# Patient Record
Sex: Female | Born: 1975 | Race: Black or African American | Hispanic: No | Marital: Married | Smoking: Never smoker
Health system: Southern US, Community
[De-identification: ages and names within clinical notes are randomized; demographics above are authoritative.]

## PROBLEM LIST (undated history)

## (undated) DIAGNOSIS — N390 Urinary tract infection, site not specified: Secondary | ICD-10-CM

## (undated) DIAGNOSIS — K5792 Diverticulitis of intestine, part unspecified, without perforation or abscess without bleeding: Secondary | ICD-10-CM

## (undated) DIAGNOSIS — M545 Low back pain, unspecified: Secondary | ICD-10-CM

## (undated) DIAGNOSIS — B019 Varicella without complication: Secondary | ICD-10-CM

## (undated) DIAGNOSIS — O09529 Supervision of elderly multigravida, unspecified trimester: Secondary | ICD-10-CM

## (undated) DIAGNOSIS — E559 Vitamin D deficiency, unspecified: Secondary | ICD-10-CM

## (undated) HISTORY — DX: Urinary tract infection, site not specified: N39.0

## (undated) HISTORY — DX: Vitamin D deficiency, unspecified: E55.9

## (undated) HISTORY — DX: Diverticulitis of intestine, part unspecified, without perforation or abscess without bleeding: K57.92

## (undated) HISTORY — PX: NO PAST SURGERIES: SHX2092

## (undated) HISTORY — DX: Varicella without complication: B01.9

## (undated) HISTORY — DX: Low back pain, unspecified: M54.50

## (undated) HISTORY — DX: Low back pain: M54.5

---

## 2005-06-13 ENCOUNTER — Emergency Department: Payer: Self-pay | Admitting: Emergency Medicine

## 2006-09-24 ENCOUNTER — Ambulatory Visit: Payer: Self-pay | Admitting: Internal Medicine

## 2008-11-13 ENCOUNTER — Emergency Department: Payer: Self-pay | Admitting: Emergency Medicine

## 2010-07-10 ENCOUNTER — Ambulatory Visit: Payer: Self-pay | Admitting: Internal Medicine

## 2012-03-27 ENCOUNTER — Emergency Department: Payer: Self-pay | Admitting: Emergency Medicine

## 2013-04-30 ENCOUNTER — Emergency Department: Payer: Self-pay | Admitting: Internal Medicine

## 2013-07-10 ENCOUNTER — Emergency Department: Payer: Self-pay

## 2013-07-10 LAB — URINALYSIS, COMPLETE
BACTERIA: NONE SEEN
RBC,UR: 6 /HPF (ref 0–5)
SPECIFIC GRAVITY: 1.029 (ref 1.003–1.030)
Squamous Epithelial: 7
WBC UR: 22 /HPF (ref 0–5)

## 2014-10-28 ENCOUNTER — Other Ambulatory Visit: Payer: Self-pay | Admitting: Obstetrics and Gynecology

## 2014-10-28 DIAGNOSIS — Z3689 Encounter for other specified antenatal screening: Secondary | ICD-10-CM

## 2014-11-01 ENCOUNTER — Ambulatory Visit
Admission: RE | Admit: 2014-11-01 | Discharge: 2014-11-01 | Disposition: A | Discharge status: Home / Self Care | Payer: 59 | Source: Ambulatory Visit | Attending: Maternal & Fetal Medicine | Admitting: Maternal & Fetal Medicine

## 2014-11-01 ENCOUNTER — Ambulatory Visit: Payer: Self-pay

## 2014-11-01 DIAGNOSIS — Z36 Encounter for antenatal screening of mother: Secondary | ICD-10-CM | POA: Diagnosis not present

## 2014-11-01 DIAGNOSIS — O09521 Supervision of elderly multigravida, first trimester: Secondary | ICD-10-CM | POA: Insufficient documentation

## 2014-11-01 DIAGNOSIS — Z3A10 10 weeks gestation of pregnancy: Secondary | ICD-10-CM | POA: Insufficient documentation

## 2014-11-01 DIAGNOSIS — Z3689 Encounter for other specified antenatal screening: Secondary | ICD-10-CM

## 2014-11-01 HISTORY — DX: Supervision of elderly multigravida, unspecified trimester: O09.529

## 2015-03-12 ENCOUNTER — Emergency Department
Admission: EM | Admit: 2015-03-12 | Discharge: 2015-03-12 | Disposition: A | Discharge status: Home / Self Care | Payer: 59 | Attending: Emergency Medicine | Admitting: Emergency Medicine

## 2015-03-12 ENCOUNTER — Encounter: Payer: Self-pay | Admitting: Emergency Medicine

## 2015-03-12 DIAGNOSIS — H6123 Impacted cerumen, bilateral: Secondary | ICD-10-CM | POA: Diagnosis not present

## 2015-03-12 DIAGNOSIS — Z3A37 37 weeks gestation of pregnancy: Secondary | ICD-10-CM | POA: Diagnosis not present

## 2015-03-12 DIAGNOSIS — O9989 Other specified diseases and conditions complicating pregnancy, childbirth and the puerperium: Secondary | ICD-10-CM | POA: Insufficient documentation

## 2015-03-12 DIAGNOSIS — O09523 Supervision of elderly multigravida, third trimester: Secondary | ICD-10-CM | POA: Diagnosis not present

## 2015-03-12 DIAGNOSIS — Z79899 Other long term (current) drug therapy: Secondary | ICD-10-CM | POA: Insufficient documentation

## 2015-03-12 MED ORDER — CARBAMIDE PEROXIDE 6.5 % OT SOLN
OTIC | Status: AC
  Administered 2015-03-12: 5 [drp] via OTIC
  Filled 2015-03-12: qty 15

## 2015-03-12 MED ORDER — CARBAMIDE PEROXIDE 6.5 % OT SOLN
5.0000 [drp] | Freq: Once | OTIC | Status: AC
  Administered 2015-03-12: 5 [drp] via OTIC

## 2015-03-12 NOTE — ED Provider Notes (Signed)
[facility] Emergency Department Provider Note  ____________________________________________  Time seen: Approximately 7:22 PM  I have reviewed the triage vital signs and the nursing notes.   HISTORY  Chief Complaint Otalgia   HPI Marilyn Hamilton is a 39 y.o. female who presents to the emergency department for evaluation of right earache. She states that the left one is also somewhat painful, but the right one is very painful and she has very little hearing on the right side. She states that about 6 weeks ago she advised her gynecologist of symptoms and he told her at that time that she had some fluid in her ears as well as a little bit of wax buildup. Over the past3 days she has had an increase in pain and a decrease in hearing in the right side. She is [redacted] weeks pregnant and has not taken any medication.   Past Medical History  Diagnosis Date  . AMA (advanced maternal age) multigravida 35+     There are no active problems to display for this patient.   Past Surgical History  Procedure Laterality Date  . No past surgeries      Current Outpatient Rx  Name  Route  Sig  Dispense  Refill  . Prenatal Vit-Fe Fumarate-FA (PRENATAL MULTIVITAMIN) TABS tablet   Oral   Take 1 tablet by mouth daily at 12 noon.           Allergies Review of patient's allergies indicates no known allergies.  No family history on file.  Social History Social History  Substance Use Topics  . Smoking status: Never Smoker   . Smokeless tobacco: Never Used  . Alcohol Use: No    Review of Systems Constitutional: Negative for fever. Eyes: No visual changes. ENT: Negative for sore throat; negative for difficulty swallowing. Positive for otalgia bilaterally Respiratory: Denies shortness of breath. Gastrointestinal: No abdominal pain.  No nausea, no vomiting.  No diarrhea.  Genitourinary: Negative for dysuria. Musculoskeletal: Negative for generalized body aches. Skin:  Negative for rash. Neurological: Negative for headaches, focal weakness or numbness.  10-point ROS otherwise negative.  ____________________________________________   PHYSICAL EXAM:  VITAL SIGNS: ED Triage Vitals  Enc Vitals Group     BP 03/12/15 1834 119/59 mmHg     Pulse Rate 03/12/15 1834 83     Resp 03/12/15 1834 20     Temp 03/12/15 1834 98.4 F (36.9 C)     Temp Source 03/12/15 1834 Oral     SpO2 03/12/15 1834 99 %     Weight 03/12/15 1834 225 lb (102.059 kg)     Height 03/12/15 1834 5\' 4"  (1.626 m)     Head Cir --      Peak Flow --      Pain Score 03/12/15 1835 5     Pain Loc --      Pain Edu? --      Excl. in GC? --     Constitutional: Alert and oriented. Well appearing and in no acute distress. Eyes: Conjunctivae are normal. PERRL. EOMI. Head: Atraumatic. Nose: No congestion/rhinnorhea. Mouth/Throat: Mucous membranes are moist.  Oropharynx not erythematous, no exudate. Ears: Cerumen impaction noted in the right that is occluding view of the tympanic membrane, partial cerumen occlusion in the left however the tympanic membrane is visible and appears to be normal. Neck: No stridor.  Lymphatic: Lymphadenopathy: No palpable nodes Cardiovascular: Normal rate, regular rhythm. Good peripheral circulation. Respiratory: Normal respiratory effort. Lungs CTAB. Gastrointestinal: Soft and nontender.  Musculoskeletal: No lower extremity tenderness nor edema.   Neurologic:  Normal speech and language. No gross focal neurologic deficits are appreciated. Speech is normal. No gait instability. Skin:  Skin is warm, dry and intact. No rash noted Psychiatric: Mood and affect are normal. Speech and behavior are normal.  ____________________________________________   LABS (all labs ordered are listed, but only abnormal results are displayed)  Labs Reviewed - No data to  display ____________________________________________  EKG   ____________________________________________  RADIOLOGY   ____________________________________________   PROCEDURES  Procedure(s) performed:   Cerumen disimpacted via curet. The remainder of the impaction was removed via irrigation with warm tap water and peroxide with good results. Patient reported being able to hear almost normally out of the right ear after irrigation. Tympanic membrane was then visible and does not appear to be punctured, nor does there appear to be any infection.  Critical Care performed: No  ____________________________________________   INITIAL IMPRESSION / ASSESSMENT AND PLAN / ED COURSE  Pertinent labs & imaging results that were available during my care of the patient were reviewed by me and considered in my medical decision making (see chart for details).  Patient was advised to follow-up with the primary care provider for symptoms are not improving. She was advised not to use cotton balls or Q-tips in her ears. She was advised to return to the emergency department for symptoms that change or worsen if she is unable schedule an appointment. ____________________________________________   FINAL CLINICAL IMPRESSION(S) / ED DIAGNOSES  Final diagnoses:  Cerumen impaction, bilateral      Chinita Pester, FNP 03/12/15 2311  Sharman Cheek, MD 03/15/15 1517

## 2015-03-12 NOTE — ED Notes (Signed)
Bilateral earache x 3 days, no fevers. [redacted] weeks pregnant with no problems related to pregnancy.

## 2015-03-12 NOTE — ED Notes (Signed)
Medication applied to right ear and 10 minutes later ear was irrigated. Small amount of ear wax irrigated from ear. But pt reports no improvement in hearing. Medication applied to left ear and small amount reapplied to right ear.

## 2015-03-28 ENCOUNTER — Inpatient Hospital Stay
Admission: EM | Admit: 2015-03-28 | Discharge: 2015-04-01 | DRG: 765 | Disposition: A | Discharge status: Home / Self Care | Payer: 59 | Attending: Obstetrics & Gynecology | Admitting: Obstetrics & Gynecology

## 2015-03-28 DIAGNOSIS — O9081 Anemia of the puerperium: Secondary | ICD-10-CM | POA: Diagnosis not present

## 2015-03-28 DIAGNOSIS — O339 Maternal care for disproportion, unspecified: Secondary | ICD-10-CM | POA: Diagnosis present

## 2015-03-28 DIAGNOSIS — Z302 Encounter for sterilization: Secondary | ICD-10-CM | POA: Diagnosis not present

## 2015-03-28 DIAGNOSIS — O480 Post-term pregnancy: Principal | ICD-10-CM | POA: Diagnosis present

## 2015-03-28 DIAGNOSIS — D62 Acute posthemorrhagic anemia: Secondary | ICD-10-CM | POA: Diagnosis not present

## 2015-03-28 DIAGNOSIS — Z6839 Body mass index (BMI) 39.0-39.9, adult: Secondary | ICD-10-CM

## 2015-03-28 DIAGNOSIS — O99824 Streptococcus B carrier state complicating childbirth: Secondary | ICD-10-CM | POA: Diagnosis present

## 2015-03-28 DIAGNOSIS — Z3A39 39 weeks gestation of pregnancy: Secondary | ICD-10-CM | POA: Diagnosis not present

## 2015-03-28 DIAGNOSIS — O99214 Obesity complicating childbirth: Secondary | ICD-10-CM | POA: Diagnosis present

## 2015-03-28 DIAGNOSIS — E669 Obesity, unspecified: Secondary | ICD-10-CM | POA: Diagnosis present

## 2015-03-28 DIAGNOSIS — Z98891 History of uterine scar from previous surgery: Secondary | ICD-10-CM

## 2015-03-28 DIAGNOSIS — O09523 Supervision of elderly multigravida, third trimester: Secondary | ICD-10-CM

## 2015-03-28 DIAGNOSIS — O133 Gestational [pregnancy-induced] hypertension without significant proteinuria, third trimester: Secondary | ICD-10-CM | POA: Diagnosis present

## 2015-03-28 LAB — CBC
HCT: 36.9 % (ref 35.0–47.0)
Hemoglobin: 12.1 g/dL (ref 12.0–16.0)
MCH: 29.8 pg (ref 26.0–34.0)
MCHC: 32.7 g/dL (ref 32.0–36.0)
MCV: 91.1 fL (ref 80.0–100.0)
PLATELETS: 214 10*3/uL (ref 150–440)
RBC: 4.06 MIL/uL (ref 3.80–5.20)
RDW: 15.4 % — AB (ref 11.5–14.5)
WBC: 10.5 10*3/uL (ref 3.6–11.0)

## 2015-03-28 LAB — TYPE AND SCREEN
ABO/RH(D): O POS
Antibody Screen: NEGATIVE

## 2015-03-28 LAB — ABO/RH: ABO/RH(D): O POS

## 2015-03-28 MED ORDER — OXYTOCIN 40 UNITS IN LACTATED RINGERS INFUSION - SIMPLE MED
2.5000 [IU]/h | INTRAVENOUS | Status: DC
  Filled 2015-03-28: qty 1000

## 2015-03-28 MED ORDER — MISOPROSTOL 200 MCG PO TABS: 800.0000 ug | ORAL_TABLET | Freq: Once | ORAL | Status: DC | PRN

## 2015-03-28 MED ORDER — OXYTOCIN BOLUS FROM INFUSION: 500.0000 mL | INTRAVENOUS | Status: DC

## 2015-03-28 MED ORDER — DINOPROSTONE 10 MG VA INST
10.0000 mg | VAGINAL_INSERT | Freq: Once | VAGINAL | Status: AC
  Administered 2015-03-28: 10 mg via VAGINAL
  Filled 2015-03-28: qty 1

## 2015-03-28 MED ORDER — LIDOCAINE HCL (PF) 1 % IJ SOLN: 30.0000 mL | INTRAMUSCULAR | Status: DC | PRN

## 2015-03-28 MED ORDER — AMMONIA AROMATIC IN INHA: 0.3000 mL | Freq: Once | RESPIRATORY_TRACT | Status: DC | PRN

## 2015-03-28 MED ORDER — LACTATED RINGERS IV SOLN: 500.0000 mL | INTRAVENOUS | Status: DC | PRN

## 2015-03-28 MED ORDER — AMMONIA AROMATIC IN INHA
RESPIRATORY_TRACT | Status: AC
Start: 2015-03-28 — End: 2015-03-29
  Filled 2015-03-28: qty 10

## 2015-03-28 MED ORDER — LIDOCAINE HCL (PF) 1 % IJ SOLN
INTRAMUSCULAR | Status: AC
  Filled 2015-03-28: qty 30

## 2015-03-28 MED ORDER — SODIUM CHLORIDE 0.9 % IV SOLN
2.0000 g | Freq: Once | INTRAVENOUS | Status: AC
  Administered 2015-03-28: 2 g via INTRAVENOUS
  Filled 2015-03-28: qty 2000

## 2015-03-28 MED ORDER — ZOLPIDEM TARTRATE 5 MG PO TABS
5.0000 mg | ORAL_TABLET | Freq: Every evening | ORAL | Status: DC | PRN
Start: 2015-03-28 — End: 2015-03-30
  Administered 2015-03-28: 5 mg via ORAL
  Filled 2015-03-28: qty 1

## 2015-03-28 MED ORDER — SODIUM CHLORIDE 0.9 % IV SOLN
1.0000 g | INTRAVENOUS | Status: DC
  Administered 2015-03-29 (×6): 1 g via INTRAVENOUS
  Filled 2015-03-28 (×6): qty 1000

## 2015-03-28 MED ORDER — LACTATED RINGERS IV SOLN
INTRAVENOUS | Status: DC
  Administered 2015-03-28 – 2015-03-29 (×2): via INTRAVENOUS

## 2015-03-28 MED ORDER — MISOPROSTOL 200 MCG PO TABS
ORAL_TABLET | ORAL | Status: AC
  Filled 2015-03-28: qty 4

## 2015-03-28 MED ORDER — TERBUTALINE SULFATE 1 MG/ML IJ SOLN: 0.2500 mg | Freq: Once | INTRAMUSCULAR | Status: DC | PRN

## 2015-03-28 MED ORDER — ONDANSETRON HCL 4 MG/2ML IJ SOLN: 4.0000 mg | Freq: Four times a day (QID) | INTRAMUSCULAR | Status: DC | PRN

## 2015-03-28 MED ORDER — OXYTOCIN 10 UNIT/ML IJ SOLN
INTRAMUSCULAR | Status: AC
  Filled 2015-03-28: qty 2

## 2015-03-28 NOTE — H&P (Signed)
Date of Initial H&P: 03/28/2015  History reviewed, patient examined, no change in H&P  40 year old 354 [id number] Black female with EDC=03/29/2015 by a 10 wk scan presents at 39.6 weeks for an IOL for advanced maternal age 42>40. [facility] for a normal anatomy scan (declined genetic testing), obesity with BMI=37and a total weight gain of 30#, a mildly elevated hmg A1C of 5.7% with a normal 1 hr GTTof 88 and 128, and positive GBS. Her last growth scan at 34 weeks revealed EFW in the 40th% (5#7oz). Nonstress tests since 36 weeks have been reactive and AFIs WNL.   O POS/ RI/ VI/ GBS positive/ TDAP UTD  Exam: BP 131/78 mmHg  Pulse 80  Temp(Src) 98 F (36.7 C) (Oral)  Resp 18  Ht 5\' 4"  (1.626 m)  Wt 105.235 kg (232 lb)  BMI 39.80 kg/m2  FHR: 140s with accelerations to 160s to 170, moderate variability Toco: mild contractions q3-9 min apart Cervix: closed/ 50%/-1/ vtx/ posterior/ med   A: IUP at 39.6 weeks for IOL for AMA Cat 1 tracing Bishop score of 4  P: Cervidil this evening Reviewed process of induction and risks of hyperstimulation,  fetal intolerance, failed induction of labor, failure to progress and C-section. She agrees with POM. Desires epidural in active labor Desires BTL if has Cesarean section GBS positive-start PPX  Siler Mavis, CNM

## 2015-03-29 ENCOUNTER — Inpatient Hospital Stay: Payer: 59 | Admitting: Anesthesiology

## 2015-03-29 DIAGNOSIS — O133 Gestational [pregnancy-induced] hypertension without significant proteinuria, third trimester: Secondary | ICD-10-CM | POA: Diagnosis present

## 2015-03-29 LAB — COMPREHENSIVE METABOLIC PANEL
ALBUMIN: 3 g/dL — AB (ref 3.5–5.0)
ALK PHOS: 170 U/L — AB (ref 38–126)
ALT: 12 U/L — AB (ref 14–54)
AST: 20 U/L (ref 15–41)
Anion gap: 9 (ref 5–15)
BUN: 11 mg/dL (ref 6–20)
CALCIUM: 9.7 mg/dL (ref 8.9–10.3)
CHLORIDE: 107 mmol/L (ref 101–111)
CO2: 20 mmol/L — AB (ref 22–32)
CREATININE: 0.77 mg/dL (ref 0.44–1.00)
GFR calc Af Amer: >60 mL/min (ref >60)
GFR calc non Af Amer: >60 mL/min (ref >60)
GLUCOSE: 90 mg/dL (ref 65–99)
Potassium: 3.8 mmol/L (ref 3.5–5.1)
SODIUM: 136 mmol/L (ref 135–145)
Total Bilirubin: 0.4 mg/dL (ref 0.3–1.2)
Total Protein: 6.7 g/dL (ref 6.5–8.1)

## 2015-03-29 LAB — PROTEIN / CREATININE RATIO, URINE
Creatinine, Urine: 177 mg/dL
Protein Creatinine Ratio: 0.14 mg/mg{Cre} (ref 0.00–0.15)
TOTAL PROTEIN, URINE: 24 mg/dL

## 2015-03-29 LAB — CBC
HEMATOCRIT: 37.1 % (ref 35.0–47.0)
Hemoglobin: 12.3 g/dL (ref 12.0–16.0)
MCH: 30.6 pg (ref 26.0–34.0)
MCHC: 33.1 g/dL (ref 32.0–36.0)
MCV: 92.6 fL (ref 80.0–100.0)
PLATELETS: 211 10*3/uL (ref 150–440)
RBC: 4.01 MIL/uL (ref 3.80–5.20)
RDW: 15.4 % — AB (ref 11.5–14.5)
WBC: 13.8 10*3/uL — AB (ref 3.6–11.0)

## 2015-03-29 MED ORDER — SODIUM CHLORIDE 0.9 % IJ SOLN
INTRAMUSCULAR | Status: AC
  Filled 2015-03-29: qty 12

## 2015-03-29 MED ORDER — FENTANYL 2.5 MCG/ML W/ROPIVACAINE 0.2% IN NS 100 ML EPIDURAL INFUSION (ARMC-ANES)
EPIDURAL | Status: AC
  Administered 2015-03-29: 8 mL/h via EPIDURAL
  Filled 2015-03-29: qty 100

## 2015-03-29 MED ORDER — IBUPROFEN 600 MG PO TABS: 600.0000 mg | ORAL_TABLET | Freq: Four times a day (QID) | ORAL | Status: DC | PRN

## 2015-03-29 MED ORDER — NALOXONE HCL 0.4 MG/ML IJ SOLN: 0.4000 mg | INTRAMUSCULAR | Status: DC | PRN

## 2015-03-29 MED ORDER — SCOPOLAMINE 1 MG/3DAYS TD PT72: 1.0000 | MEDICATED_PATCH | Freq: Once | TRANSDERMAL | Status: DC

## 2015-03-29 MED ORDER — BUTORPHANOL TARTRATE 1 MG/ML IJ SOLN
1.0000 mg | INTRAMUSCULAR | Status: DC | PRN
  Administered 2015-03-29: 1 mg via INTRAVENOUS

## 2015-03-29 MED ORDER — OXYTOCIN 40 UNITS IN LACTATED RINGERS INFUSION - SIMPLE MED
1.0000 m[IU]/min | INTRAVENOUS | Status: DC
  Administered 2015-03-29: 2 m[IU]/min via INTRAVENOUS

## 2015-03-29 MED ORDER — FENTANYL 2.5 MCG/ML W/ROPIVACAINE 0.2% IN NS 100 ML EPIDURAL INFUSION (ARMC-ANES)
10.0000 mL/h | EPIDURAL | Status: DC
  Administered 2015-03-29: 8 mL/h via EPIDURAL

## 2015-03-29 MED ORDER — DIPHENHYDRAMINE HCL 50 MG/ML IJ SOLN: 12.5000 mg | INTRAMUSCULAR | Status: DC | PRN

## 2015-03-29 MED ORDER — NALBUPHINE HCL 10 MG/ML IJ SOLN: 5.0000 mg | INTRAMUSCULAR | Status: DC | PRN

## 2015-03-29 MED ORDER — BUTORPHANOL TARTRATE 1 MG/ML IJ SOLN
2.0000 mg | INTRAMUSCULAR | Status: DC | PRN
  Administered 2015-03-29 (×2): 2 mg via INTRAVENOUS
  Filled 2015-03-29 (×2): qty 2

## 2015-03-29 MED ORDER — DIPHENHYDRAMINE HCL 25 MG PO CAPS: 25.0000 mg | ORAL_CAPSULE | ORAL | Status: DC | PRN

## 2015-03-29 MED ORDER — NALBUPHINE HCL 10 MG/ML IJ SOLN: 5.0000 mg | Freq: Once | INTRAMUSCULAR | Status: DC | PRN

## 2015-03-29 MED ORDER — SODIUM CHLORIDE 0.9 % IJ SOLN: 3.0000 mL | INTRAMUSCULAR | Status: DC | PRN

## 2015-03-29 MED ORDER — LIDOCAINE-EPINEPHRINE (PF) 1.5 %-1:200000 IJ SOLN
INTRAMUSCULAR | Status: AC | PRN
Start: 2015-03-29 — End: ?
  Administered 2015-03-29: 3 mL via EPIDURAL

## 2015-03-29 MED ORDER — ONDANSETRON HCL 4 MG/2ML IJ SOLN: 4.0000 mg | Freq: Three times a day (TID) | INTRAMUSCULAR | Status: DC | PRN

## 2015-03-29 MED ORDER — TERBUTALINE SULFATE 1 MG/ML IJ SOLN: 0.2500 mg | Freq: Once | INTRAMUSCULAR | Status: DC | PRN

## 2015-03-29 MED ORDER — MEPERIDINE HCL 25 MG/ML IJ SOLN: 6.2500 mg | INTRAMUSCULAR | Status: DC | PRN

## 2015-03-29 MED ORDER — NALOXONE HCL 2 MG/2ML IJ SOSY
1.0000 ug/kg/h | PREFILLED_SYRINGE | INTRAVENOUS | Status: DC | PRN
  Filled 2015-03-29: qty 2

## 2015-03-29 MED ORDER — BUTORPHANOL TARTRATE 1 MG/ML IJ SOLN
INTRAMUSCULAR | Status: AC
  Administered 2015-03-29: 1 mg via INTRAVENOUS
  Filled 2015-03-29: qty 1

## 2015-03-29 NOTE — Anesthesia Procedure Notes (Signed)
Epidural Patient location during procedure: OB Start time: 03/29/2015 8:25 PM End time: 03/29/2015 8:48 PM  Staffing Performed by: anesthesiologist   Preanesthetic Checklist Completed: patient identified, site marked, surgical consent, pre-op evaluation, timeout performed, IV checked, risks and benefits discussed and monitors and equipment checked  Epidural Patient position: sitting Prep: Betadine Patient monitoring: heart rate, continuous pulse ox and blood pressure Approach: midline Location: L4-L5 Injection technique: LOR saline  Needle:  Needle type: Tuohy  Needle gauge: 17 G Needle length: 9 cm and 9 Needle insertion depth: 9 cm Catheter type: closed end flexible Catheter size: 20 Guage Catheter at skin depth: 13 cm Test dose: negative and 1.5% lidocaine with Epi 1:200 K  Assessment Events: blood not aspirated, injection not painful, no injection resistance, negative IV test and no paresthesia  Additional Notes   Patient tolerated the insertion well without complications.Reason for block:procedure for pain

## 2015-03-29 NOTE — Progress Notes (Signed)
L&D Note  03/29/2015 - 9:57 AM  39 y.o. [id number] [redacted]w[redacted]d   Marilyn Hamilton is admitted for IOL for AMA   Subjective:  Resting after stadol dose   Objective:   Filed Vitals:   03/28/15 2317 03/29/15 0234 03/29/15 0516 03/29/15 0844  BP: 146/88 142/86 138/92 128/94  Pulse: 74 87 74 89  Temp: 98 F (36.7 C)   97.7 F (36.5 C)  TempSrc: Oral   Oral  Resp:    18  Height:      Weight:        Current Vital Signs 24h Vital Sign Ranges  T 97.7 F (36.5 C) Temp  Avg: 97.9 F (36.6 C)  Min: 97.7 F (36.5 C)  Max: 98 F (36.7 C)  BP (!) 128/94 mmHg BP  Min: 128/94  Max: 146/88  HR 89 Pulse  Avg: 80.3  Min: 74  Max: 89  RR 18 Resp  Avg: 18  Min: 18  Max: 18  SaO2   Not Delivered No Data Recorded       24 Hour I/O Current Shift I/O  Time Ins Outs        FHR: category 1, baseline 135, mod var, + accels, no decels Toco: q 1.5-4 min SVE: 1/75/-2   Assessment :  IUP at 40 wks, IOL for AMA    Plan:  Foley bulb placed and balloon inflated with 30 cc sterile saline, traction applied   Marilyn Hamilton, [place]

## 2015-03-29 NOTE — Anesthesia Preprocedure Evaluation (Signed)
Anesthesia Evaluation  Patient identified by MRN, date of birth, ID band Patient awake    Reviewed: Allergy & Precautions, NPO status , Patient's Chart, lab work & pertinent test results  History of Anesthesia Complications Negative for: history of anesthetic complications  Airway Mallampati: III       Dental   Pulmonary neg pulmonary ROS,           Cardiovascular hypertension (PIH),      Neuro/Psych negative neurological ROS     GI/Hepatic negative GI ROS, Neg liver ROS,   Endo/Other  negative endocrine ROS  Renal/GU negative Renal ROS     Musculoskeletal   Abdominal   Peds  Hematology negative hematology ROS (+)   Anesthesia Other Findings   Reproductive/Obstetrics (+) Pregnancy                             Anesthesia Physical Anesthesia Plan  ASA: II  Anesthesia Plan: Epidural   Post-op Pain Management:    Induction:   Airway Management Planned:   Additional Equipment:   Intra-op Plan:   Post-operative Plan:   Informed Consent: I have reviewed the patients History and Physical, chart, labs and discussed the procedure including the risks, benefits and alternatives for the proposed anesthesia with the patient or authorized representative who has indicated his/her understanding and acceptance.     Plan Discussed with:   Anesthesia Plan Comments:         Anesthesia Quick Evaluation

## 2015-03-29 NOTE — Progress Notes (Signed)
  Labor Progress Note   40 y.o. [id number] @ [redacted]w[redacted]d , admitted for  Pregnancy, Labor Management. IOL, AMA and Obesity.  Subjective:  No pain w epidural.  Objective:  BP 127/77 mmHg  Pulse 93  Temp(Src) 99.3 F (37.4 C) (Oral)  Resp 20  Ht  (1.626 m)  Wt 232 lb (105.235 kg)  BMI 39.80 kg/m2 BPs 130s-140s/80s-90s Abd: Vtx SVE C/C/0  EFM: FHR: 140 bpm, variability: moderate,  accelerations:  Present,  decelerations:  Absent  Pt had some decels when she progressed from 4 to 9 over an hours time, and they have resolved w positioning and O2 Toco: Frequency: Every 2-4 minutes  Assessment & Plan:  [id number] @ [redacted]w[redacted]d, admitted for  Pregnancy and Labor/Delivery Management  1. Pain management: none. 2. FWB: FHT category 1.  3. ID: GBS positive 4. Labor management: Cont  Pitocin.  Epidural.  ABX for GBS. Start pushing soon (pt numb at the moment) Monitor BPs.   All discussed with patient, see orders

## 2015-03-29 NOTE — Progress Notes (Signed)
  Labor Progress Note   40 y.o. [id number] @ [redacted]w[redacted]d , admitted for  Pregnancy, Labor Management. IOL, AMA and Obesity.  Subjective:  Mild-mod pain.   Denies h/a, change in vision, epig pain, CP, SOB, edema.  Objective:  BP 138/83 mmHg  Pulse 81  Temp(Src) 98.2 F (36.8 C) (Oral)  Resp 20  Ht  (1.626 m)  Wt 232 lb (105.235 kg)  BMI 39.80 kg/m2 Some BPs 130s-150s/80s-100s Abd: Vtx Extr: trace to 1+ bilateral pedal edema SVE: catheter removed w gentle traction by self.  2/80/-3.  EFM: FHR: 140 bpm, variability: moderate,  accelerations:  Present,  decelerations:  Absent Toco: Frequency: Every 3-6 minutes on Pitocin 8 mU/min  Assessment & Plan:  [id number] @ [redacted]w[redacted]d, admitted for  Pregnancy and Labor/Delivery Management  1. Pain management: none. 2. FWB: FHT category 1.  3. ID: GBS positive 4. Labor management: Cont  Pitocin.  Epidural when ready.  ABX for GBS. Monitor BPs.  Labs.  All discussed with patient, see orders

## 2015-03-29 NOTE — Progress Notes (Signed)
  Labor Progress Note   40 y.o. [id number] @ [redacted]w[redacted]d , admitted for  Pregnancy, Labor Management. IOL, AMA and Obesity.  Subjective:  Mild pain.    Objective:  BP 137/82 mmHg  Pulse 81  Temp(Src) 98.5 F (36.9 C) (Oral)  Resp 18  Ht  (1.626 m)  Wt 232 lb (105.235 kg)  BMI 39.80 kg/m2 Abd: Vtx Extr: trace to 1+ bilateral pedal edema SVE: catheter in place  EFM: FHR: 140 bpm, variability: moderate,  accelerations:  Present,  decelerations:  Absent Toco: Frequency: Every 3-6 minutes Labs: I have reviewed the patient's lab results.   Assessment & Plan:  [id number] @ [redacted]w[redacted]d, admitted for  Pregnancy and Labor/Delivery Management  1. Pain management: none. 2. FWB: FHT category 1.  3. ID: GBS positive 4. Labor management: Cont catheter and start Pitocin soon.  Epidural when ready.  ABX for GBS.  All discussed with patient, see orders

## 2015-03-29 NOTE — Progress Notes (Signed)
  Labor Progress Note   40 y.o. [id number] @ [redacted]w[redacted]d , admitted for  Pregnancy, Labor Management. IOL, AMA and Obesity.  Subjective:  Mild-mod pain.  SROM. Denies h/a, change in vision, epig pain, CP, SOB, edema.  Objective:  BP 164/97 mmHg  Pulse 86  Temp(Src) 99.3 F (37.4 C) (Oral)  Resp 20  Ht  (1.626 m)  Wt 232 lb (105.235 kg)  BMI 39.80 kg/m2 Some BPs 130s-150s/80s-100s Abd: Vtx Extr: trace to 1+ bilateral pedal edema  EFM: FHR: 140 bpm, variability: moderate,  accelerations:  Present,  decelerations:  Absent Toco: Frequency: Every 3-6 minutes  Assessment & Plan:  [id number] @ [redacted]w[redacted]d, admitted for  Pregnancy and Labor/Delivery Management  1. Pain management: none. 2. FWB: FHT category 1.  3. ID: GBS positive 4. Labor management: Cont  Pitocin.  Epidural.  ABX for GBS. Monitor BPs.   All discussed with patient, see orders

## 2015-03-30 ENCOUNTER — Encounter
Admission: EM | Disposition: A | Discharge status: Home / Self Care | Payer: Self-pay | Source: Home / Self Care | Attending: Obstetrics & Gynecology

## 2015-03-30 ENCOUNTER — Inpatient Hospital Stay: Payer: 59 | Admitting: Anesthesiology

## 2015-03-30 DIAGNOSIS — Z98891 History of uterine scar from previous surgery: Secondary | ICD-10-CM

## 2015-03-30 LAB — CBC
HCT: 28.9 % — ABNORMAL LOW (ref 35.0–47.0)
HEMOGLOBIN: 9.3 g/dL — AB (ref 12.0–16.0)
MCH: 30 pg (ref 26.0–34.0)
MCHC: 32.2 g/dL (ref 32.0–36.0)
MCV: 93.2 fL (ref 80.0–100.0)
PLATELETS: 181 10*3/uL (ref 150–440)
RBC: 3.1 MIL/uL — ABNORMAL LOW (ref 3.80–5.20)
RDW: 15.6 % — AB (ref 11.5–14.5)
WBC: 20.3 10*3/uL — ABNORMAL HIGH (ref 3.6–11.0)

## 2015-03-30 LAB — RPR: RPR: NONREACTIVE

## 2015-03-30 SURGERY — Surgical Case
Anesthesia: Choice | Site: Abdomen | Wound class: Clean Contaminated

## 2015-03-30 MED ORDER — OXYTOCIN 10 UNIT/ML IJ SOLN
2.5000 [IU]/h | INTRAVENOUS | Status: AC
  Filled 2015-03-30: qty 4

## 2015-03-30 MED ORDER — ONDANSETRON HCL 4 MG/2ML IJ SOLN
INTRAMUSCULAR | Status: DC | PRN
  Administered 2015-03-30: 4 mg via INTRAVENOUS

## 2015-03-30 MED ORDER — BUPIVACAINE 0.25 % ON-Q PUMP DUAL CATH 400 ML
INJECTION | Status: AC
  Filled 2015-03-30: qty 400

## 2015-03-30 MED ORDER — BUPIVACAINE 0.25 % ON-Q PUMP DUAL CATH 400 ML
400.0000 mL | INJECTION | Status: DC
Start: 2015-03-30 — End: 2015-04-01

## 2015-03-30 MED ORDER — LACTATED RINGERS IV SOLN
INTRAVENOUS | Status: DC | PRN
  Administered 2015-03-30: 03:00:00 via INTRAVENOUS

## 2015-03-30 MED ORDER — OXYCODONE-ACETAMINOPHEN 5-325 MG PO TABS
1.0000 | ORAL_TABLET | ORAL | Status: DC | PRN
Start: 2015-03-30 — End: 2015-04-01
  Administered 2015-03-31 – 2015-04-01 (×3): 1 via ORAL
  Filled 2015-03-30 (×2): qty 1

## 2015-03-30 MED ORDER — ONDANSETRON HCL 4 MG/2ML IJ SOLN: 4.0000 mg | Freq: Once | INTRAMUSCULAR | Status: DC | PRN

## 2015-03-30 MED ORDER — DIPHENHYDRAMINE HCL 25 MG PO CAPS
25.0000 mg | ORAL_CAPSULE | Freq: Four times a day (QID) | ORAL | Status: DC | PRN
  Administered 2015-03-31: 25 mg via ORAL

## 2015-03-30 MED ORDER — LACTATED RINGERS IV SOLN: INTRAVENOUS | Status: DC

## 2015-03-30 MED ORDER — MORPHINE SULFATE (PF) 2 MG/ML IV SOLN: 1.0000 mg | INTRAVENOUS | Status: DC | PRN

## 2015-03-30 MED ORDER — CHLORHEXIDINE GLUCONATE CLOTH 2 % EX PADS: 2.0000 | MEDICATED_PAD | Freq: Every day | CUTANEOUS | Status: DC

## 2015-03-30 MED ORDER — CEFOXITIN SODIUM-DEXTROSE 2-2.2 GM-% IV SOLR (PREMIX)
INTRAVENOUS | Status: AC
  Filled 2015-03-30: qty 50

## 2015-03-30 MED ORDER — OXYTOCIN 40 UNITS IN LACTATED RINGERS INFUSION - SIMPLE MED
INTRAVENOUS | Status: DC | PRN
  Administered 2015-03-30: 199 mL via INTRAVENOUS
  Administered 2015-03-30: 1 mL via INTRAVENOUS

## 2015-03-30 MED ORDER — IBUPROFEN 600 MG PO TABS
600.0000 mg | ORAL_TABLET | Freq: Four times a day (QID) | ORAL | Status: DC | PRN
  Administered 2015-03-30 – 2015-04-01 (×7): 600 mg via ORAL
  Filled 2015-03-30 (×7): qty 1

## 2015-03-30 MED ORDER — CEFOXITIN SODIUM-DEXTROSE 2-2.2 GM-% IV SOLR (PREMIX)
2.0000 g | INTRAVENOUS | Status: AC
  Administered 2015-03-30: 2000 mg via INTRAVENOUS

## 2015-03-30 MED ORDER — EPHEDRINE SULFATE 50 MG/ML IJ SOLN
INTRAMUSCULAR | Status: DC | PRN
  Administered 2015-03-30: 5 mg via INTRAVENOUS

## 2015-03-30 MED ORDER — WITCH HAZEL-GLYCERIN EX PADS: 1.0000 "application " | MEDICATED_PAD | CUTANEOUS | Status: DC | PRN

## 2015-03-30 MED ORDER — CITRIC ACID-SODIUM CITRATE 334-500 MG/5ML PO SOLN
30.0000 mL | Freq: Once | ORAL | Status: AC
Start: 2015-03-30 — End: 2015-03-30
  Administered 2015-03-30: 30 mL via ORAL
  Filled 2015-03-30: qty 30

## 2015-03-30 MED ORDER — SIMETHICONE 80 MG PO CHEW: 80.0000 mg | CHEWABLE_TABLET | ORAL | Status: DC | PRN

## 2015-03-30 MED ORDER — SENNOSIDES-DOCUSATE SODIUM 8.6-50 MG PO TABS
2.0000 | ORAL_TABLET | ORAL | Status: DC
  Administered 2015-03-30 – 2015-03-31 (×2): 2 via ORAL
  Filled 2015-03-30 (×2): qty 2

## 2015-03-30 MED ORDER — ACETAMINOPHEN 325 MG PO TABS: 650.0000 mg | ORAL_TABLET | ORAL | Status: DC | PRN

## 2015-03-30 MED ORDER — BUPIVACAINE HCL (PF) 0.5 % IJ SOLN
INTRAMUSCULAR | Status: DC | PRN
  Administered 2015-03-30: 10 mL

## 2015-03-30 MED ORDER — MENTHOL 3 MG MT LOZG
1.0000 | LOZENGE | OROMUCOSAL | Status: DC | PRN
  Filled 2015-03-30: qty 9

## 2015-03-30 MED ORDER — BUPIVACAINE IN DEXTROSE 0.75-8.25 % IT SOLN
INTRATHECAL | Status: DC | PRN
  Administered 2015-03-30: 1.5 mL via INTRATHECAL

## 2015-03-30 MED ORDER — OXYCODONE-ACETAMINOPHEN 5-325 MG PO TABS
2.0000 | ORAL_TABLET | ORAL | Status: DC | PRN
  Administered 2015-03-31 – 2015-04-01 (×4): 2 via ORAL
  Filled 2015-03-30 (×5): qty 2

## 2015-03-30 MED ORDER — DIBUCAINE 1 % RE OINT: 1.0000 "application " | TOPICAL_OINTMENT | RECTAL | Status: DC | PRN

## 2015-03-30 MED ORDER — ZOLPIDEM TARTRATE 5 MG PO TABS: 5.0000 mg | ORAL_TABLET | Freq: Every evening | ORAL | Status: DC | PRN

## 2015-03-30 MED ORDER — MORPHINE SULFATE (PF) 0.5 MG/ML IJ SOLN
INTRAMUSCULAR | Status: DC | PRN
  Administered 2015-03-30: .2 mg via EPIDURAL

## 2015-03-30 MED ORDER — LANOLIN HYDROUS EX OINT: 1.0000 "application " | TOPICAL_OINTMENT | CUTANEOUS | Status: DC | PRN

## 2015-03-30 MED ORDER — FENTANYL CITRATE (PF) 100 MCG/2ML IJ SOLN: 25.0000 ug | INTRAMUSCULAR | Status: DC | PRN

## 2015-03-30 MED ORDER — KETOROLAC TROMETHAMINE 30 MG/ML IJ SOLN
30.0000 mg | Freq: Four times a day (QID) | INTRAMUSCULAR | Status: AC
  Administered 2015-03-30: 30 mg via INTRAVENOUS
  Filled 2015-03-30: qty 1

## 2015-03-30 MED ORDER — OXYTOCIN 10 UNIT/ML IJ SOLN: 2.5000 [IU]/h | INTRAVENOUS | Status: DC

## 2015-03-30 MED ORDER — BUPIVACAINE HCL (PF) 0.5 % IJ SOLN
INTRAMUSCULAR | Status: AC
  Filled 2015-03-30: qty 30

## 2015-03-30 MED ORDER — SIMETHICONE 80 MG PO CHEW
80.0000 mg | CHEWABLE_TABLET | Freq: Three times a day (TID) | ORAL | Status: DC
  Administered 2015-03-31 – 2015-04-01 (×5): 80 mg via ORAL
  Filled 2015-03-30 (×7): qty 1

## 2015-03-30 MED ORDER — BUPIVACAINE HCL 0.5 % IJ SOLN
10.0000 mL | Freq: Once | INTRAMUSCULAR | Status: DC
  Filled 2015-03-30: qty 10

## 2015-03-30 MED ORDER — PRENATAL MULTIVITAMIN CH
1.0000 | ORAL_TABLET | Freq: Every day | ORAL | Status: DC
  Administered 2015-03-30 – 2015-04-01 (×3): 1 via ORAL
  Filled 2015-03-30 (×3): qty 1

## 2015-03-30 MED ORDER — SIMETHICONE 80 MG PO CHEW
80.0000 mg | CHEWABLE_TABLET | ORAL | Status: DC
  Administered 2015-03-30 – 2015-03-31 (×2): 80 mg via ORAL
  Filled 2015-03-30 (×2): qty 1

## 2015-03-30 SURGICAL SUPPLY — 24 items
BARRIER ADHS 3X4 INTERCEED (GAUZE/BANDAGES/DRESSINGS) ×3 IMPLANT
CANISTER SUCT 3000ML (MISCELLANEOUS) ×3 IMPLANT
CATH KIT ON-Q SILVERSOAK 5IN (CATHETERS) ×6 IMPLANT
CHLORAPREP W/TINT 26ML (MISCELLANEOUS) ×6 IMPLANT
DRSG OPSITE POSTOP 4X10 (GAUZE/BANDAGES/DRESSINGS) ×3 IMPLANT
ELECT CAUTERY BLADE 6.4 (BLADE) IMPLANT
ELECT REM PT RETURN 9FT ADLT (ELECTROSURGICAL) ×3
ELECTRODE REM PT RTRN 9FT ADLT (ELECTROSURGICAL) ×1 IMPLANT
GLOVE SKINSENSE NS SZ8.0 LF (GLOVE) ×10
GLOVE SKINSENSE STRL SZ8.0 LF (GLOVE) ×5 IMPLANT
GOWN STRL REUS W/ TWL LRG LVL3 (GOWN DISPOSABLE) ×1 IMPLANT
GOWN STRL REUS W/ TWL XL LVL3 (GOWN DISPOSABLE) ×2 IMPLANT
GOWN STRL REUS W/TWL LRG LVL3 (GOWN DISPOSABLE) ×2
GOWN STRL REUS W/TWL XL LVL3 (GOWN DISPOSABLE) ×4
LIQUID BAND (GAUZE/BANDAGES/DRESSINGS) ×3 IMPLANT
NS IRRIG 1000ML POUR BTL (IV SOLUTION) ×3 IMPLANT
PACK C SECTION AR (MISCELLANEOUS) ×3 IMPLANT
PAD OB MATERNITY 4.3X12.25 (PERSONAL CARE ITEMS) ×3 IMPLANT
PAD PREP 24X41 OB/GYN DISP (PERSONAL CARE ITEMS) ×3 IMPLANT
SPONGE LAP 18X18 5 PK (GAUZE/BANDAGES/DRESSINGS) ×6 IMPLANT
SUT MAXON ABS #0 GS21 30IN (SUTURE) ×6 IMPLANT
SUT VIC AB 1 CT1 36 (SUTURE) ×9 IMPLANT
SUT VIC AB 2-0 CT1 36 (SUTURE) ×3 IMPLANT
SUT VIC AB 4-0 FS2 27 (SUTURE) ×3 IMPLANT

## 2015-03-30 NOTE — Progress Notes (Signed)
  Labor Progress Note   40 y.o. [id number] @ [redacted]w[redacted]d , admitted for  Pregnancy, Labor Management. IOL, AMA and Obesity.  Subjective:  Complete for 5 hours, although had strong epidural at first and so was allowed to labor down; minimal progress with pushing and left sided pain despite adjustment attempts with epidural.  Objective:  BP 153/129 mmHg  Pulse 106  Temp(Src) 98.9 F (37.2 C) (Oral)  Resp 18  Ht  (1.626 m)  Wt 232 lb (105.235 kg)  BMI 39.80 kg/m2  SpO2 99% BPs 130s-140s/80s-90s Abd: Vtx SVE C/C/0   EFM: FHR: 140 bpm, variability: moderate,  accelerations:  Present,  decelerations:  Absent  Toco: Frequency: Every 2-4 minutes  Assessment & Plan:  [id number] @ [redacted]w[redacted]d, admitted for  Pregnancy and Labor/Delivery Management  FAILURE TO PROGRESS w ARREST OF DESCENT, CPD  1. Plan CS now as cannot tolerate labor any longer and no descent to warrant continued efforts. 2. FWB: FHT category 1.  3. ID: GBS positive 4. Plan BTL as well per pt desire for sterility.  The risks of cesarean section discussed with the patient included but were not limited to: bleeding which may require transfusion or reoperation; infection which may require antibiotics; injury to bowel, bladder, ureters or other surrounding organs; injury to the fetus; need for additional procedures including hysterectomy in the event of a life-threatening hemorrhage; placental abnormalities wth subsequent pregnancies, incisional problems, thromboembolic phenomenon and other postoperative/anesthesia complications. The patient concurred with the proposed plan, giving informed written consent for the procedure.     All discussed with patient, see orders

## 2015-03-30 NOTE — Transfer of Care (Signed)
Immediate Anesthesia Transfer of Care Note  Patient: Marilyn Hamilton  Procedure(s) Performed: Procedure(s): CESAREAN SECTION, BTL (N/A)  Patient Location: PACU  Anesthesia Type:Spinal  Level of Consciousness: awake, alert , oriented and patient cooperative  Airway & Oxygen Therapy: Patient Spontanous Breathing and Patient connected to nasal cannula oxygen  Post-op Assessment: Report given to RN and Post -op Vital signs reviewed and stable  Post vital signs: Reviewed and stable  Last Vitals:  Filed Vitals:   03/30/15 0309 03/30/15 0430  BP:    Pulse:    Temp: 36.9 C 36.8 C  Resp:      Complications: No apparent anesthesia complications

## 2015-03-30 NOTE — Discharge Summary (Signed)
Obstetrical Discharge Summary  Date of Admission: 03/28/2015 Date of Discharge: 04/01/2015 Discharge Diagnosis: Term Pregnancy-delivered, Failed induction and Cesarean for Arrest of descent/ CPD Primary OB:  [facility]   Gestational Age at Delivery: [redacted]w[redacted]d  Antepartum complications: AMA, Obesity Date of Delivery: 03/30/2015  Delivered By: Tiburcio Pea Delivery Type: primary cesarean section, low transverse incision Intrapartum complications/course: Failure to Progress and CPD Anesthesia: spinal Placenta: manual removal Laceration: n/a Episiotomy: none Live born female  Birth Weight: 6 lb 13 oz (3090 g) APGAR: 7, 9  Discharge home with mom   Post partum course: Since the delivery, patient has tolerate activity, diet, and daily functions without difficulty or complication.  Min lochia.  No breast concerns at this time.  No signs of depression currently.   Postpartum Exam:General appearance: alert, cooperative and appears stated age GI: soft, non-tender; bowel sounds normal; no masses,  no organomegaly Extremities: extremities normal, atraumatic, no cyanosis or edema Breast: normal in size and symmetry  Disposition: home with infant Rh Immune globulin given: no Rubella vaccine given: no Varicella vaccine given: no Tdap vaccine given in AP or PP setting: yes Flu vaccine given in AP or PP setting: no Contraception: bilateral tubal ligation  Prenatal Labs: O POS//Rubella Immune//RPR negative//HIV negative/HepB Surface Ag negative//plans to breastfeed  BP 139/69 mmHg  Pulse 78  Temp(Src) 98 F (36.7 C) (Oral)  Resp 18  Ht  (1.626 m)  Wt 105.235 kg (232 lb)  BMI 39.80 kg/m2  SpO2 99%  Breastfeeding  Final Lab Results Reviewed   Plan:  Marilyn Hamilton was discharged to home in good condition. Follow-up appointment with [facility] provider in 1 week  Discharge Medications:   Medication List    TAKE these medications        docusate sodium 100 MG capsule  Commonly known as:   COLACE  Take 1 capsule (100 mg total) by mouth daily.     ibuprofen 600 MG tablet  Commonly known as:  ADVIL,MOTRIN  Take 1 tablet (600 mg total) by mouth every 6 (six) hours as needed for moderate pain.     oxyCODONE-acetaminophen 5-325 MG tablet  Commonly known as:  PERCOCET/ROXICET  Take 2 tablets by mouth every 4 (four) hours as needed (pain scale > 7).     prenatal multivitamin Tabs tablet  Take 1 tablet by mouth daily at 12 noon.        Follow-up arrangements:   With Dr Tiburcio Pea in 1 week for surgical incision check With Dr Tiburcio Pea in 6 weeks for postpartum follow up  [facility], CNM   This patient and plan were discussed with Dr Elesa Massed 04/01/2015

## 2015-03-30 NOTE — Op Note (Signed)
Cesarean Section Procedure Note Indications: cephalo-pelvic disproportion, failed induction, failure to progress: arrest of descent and term intrauterine pregnancy, Desire for permanent sterility  Pre-operative Diagnosis: Intrauterine pregnancy [redacted]w[redacted]d ;  cephalo-pelvic disproportion, failed induction, failure to progress: arrest of descent and term intrauterine pregnancy, Desire for permanent sterility Post-operative Diagnosis: same, delivered. Procedure: Low Transverse Cesarean Section, Bilateral Tubal Ligation Surgeon: Annamarie Major, MD Anesthesia: Spinal anesthesia Estimated Blood Loss:300  Complications: None; patient tolerated the procedure well. Disposition: PACU - hemodynamically stable. Condition: stable  Findings: A female infant in the cephalic presentation. Amniotic fluid - Clear  Birth weight 3100 g.  Apgars of 7 and 9.  Intact placenta with a three-vessel cord. Grossly normal uterus, tubes and ovaries bilaterally. No intraabdominal adhesions were noted.  Procedure Details   The patient was taken to Operating Room, identified as the correct patient and the procedure verified as C-Section Delivery. A Time Out was held and the above information confirmed. After induction of anesthesia, the patient was draped and prepped in the usual sterile manner. A Pfannenstiel incision was made and carried down through the subcutaneous tissue to the fascia. Fascial incision was made and extended transversely with the Mayo scissors. The fascia was separated from the underlying rectus tissue superiorly and inferiorly. The peritoneum was identified and entered bluntly. Peritoneal incision was extended longitudinally. The utero-vesical peritoneal reflection was incised transversely and a bladder flap was created digitally.  A low transverse hysterotomy was made. The fetus was delivered atraumatically. The umbilical cord was clamped x2 and cut and the infant was handed to the awaiting pediatricians. The  placenta was removed intact and appeared normal with a 3-vessel cord.  The uterus was exteriorized and cleared of all clot and debris. The hysterotomy was closed with running sutures of 0 Vicryl suture. A second imbricating layer was placed with the same suture. Excellent hemostasis was observed.   The left Fallopian tube was identified, grasped with the Babcock clamps, lifted to the skin incision and followed out distally to the fimbriae. An avascular midsection of the tube approximately 3-4cm from the cornua was grasped with the babcock clamps and brought into a knuckle at the skin incision. The tube was double ligated with 2-0 Vicryl suture and the intervening portion of tube was transected and removed. Excellent hemostasis was noted and the tube was returned to the abdomen. Attention was then turned to the right fallopian tube after confirmation of identification by tracing the tube out to the fimbriae. The same procedure was then performed on the right Fallopian tube. Again, excellent hemostasis was noted at the end of the procedure.  The uterus was returned to the abdomen. The pelvis was irrigated and again, excellent hemostasis was noted.  The On Q Pain pump System was then placed.  Trocars were placed through the abdominal wall into the subfascial space and these were used to thread the silver soaker cathaters into place.The rectus fascia was then reapproximated with running sutures of Maxon, with careful placement not to incorporate the cathaters. Subcutaneous tissues are then irrigated with saline and hemostasis assured.  Skin is then closed with 4-0 vicryl suture in a subcuticular fashion followed by skin adhesive. The cathaters are flushed each with 5 mL of Bupivicaine and stabilized into place with dressing. Instrument, sponge, and needle counts were correct prior to the abdominal closure and at the conclusion of the case.  The patient tolerated the procedure well and was transferred to the  recovery room in stable condition.

## 2015-03-30 NOTE — Anesthesia Preprocedure Evaluation (Signed)
Anesthesia Evaluation  Patient identified by MRN, date of birth, ID band Patient awake    Reviewed: Allergy & Precautions, NPO status , Patient's Chart, lab work & pertinent test results  History of Anesthesia Complications Negative for: history of anesthetic complications  Airway Mallampati: III       Dental   Pulmonary neg pulmonary ROS,           Cardiovascular hypertension (PIH),      Neuro/Psych negative neurological ROS     GI/Hepatic negative GI ROS, Neg liver ROS,   Endo/Other  negative endocrine ROS  Renal/GU negative Renal ROS     Musculoskeletal   Abdominal   Peds  Hematology negative hematology ROS (+)   Anesthesia Other Findings   Reproductive/Obstetrics (+) Pregnancy                             Anesthesia Physical  Anesthesia Plan  ASA: II  Anesthesia Plan: Spinal   Post-op Pain Management:    Induction:   Airway Management Planned:   Additional Equipment:   Intra-op Plan:   Post-operative Plan:   Informed Consent: I have reviewed the patients History and Physical, chart, labs and discussed the procedure including the risks, benefits and alternatives for the proposed anesthesia with the patient or authorized representative who has indicated his/her understanding and acceptance.     Plan Discussed with:   Anesthesia Plan Comments:         Anesthesia Quick Evaluation

## 2015-03-30 NOTE — Anesthesia Procedure Notes (Signed)
Spinal Patient location during procedure: OR Start time: 03/30/2015 3:15 AM End time: 03/30/2015 3:27 AM Staffing Performed by: anesthesiologist  Preanesthetic Checklist Completed: patient identified, site marked, surgical consent, pre-op evaluation, timeout performed, IV checked, risks and benefits discussed and monitors and equipment checked Spinal Block Patient position: sitting Prep: Betadine Patient monitoring: heart rate, cardiac monitor, continuous pulse ox and blood pressure Approach: midline Location: L3-4 Injection technique: single-shot Needle Needle type: Whitacre  Needle gauge: 27 G Needle length: 9 cm Needle insertion depth: 9 cm Assessment Sensory level: T6

## 2015-03-30 NOTE — Discharge Instructions (Signed)
Cesarean Delivery, Care After  Refer to this sheet in the next few weeks. These instructions provide you with information on caring for yourself after your procedure. Your health care provider may also give you specific instructions. Your treatment has been planned according to current medical practices, but problems sometimes occur. Call your health care provider if you have any problems or questions after you go home.  HOME CARE INSTRUCTIONS   Only take over-the-counter or prescription medications as directed by your health care provider.   Do not drink alcohol, especially if you are breastfeeding or taking medication to relieve pain.   Do not chew or smoke tobacco.   Continue to use good perineal care. Good perineal care includes:    Wiping your perineum from front to back.    Keeping your perineum clean.   Check your surgical cut (incision) daily for increased redness, drainage, swelling, or separation of skin.   Clean your incision gently with soap and water every day, and then pat it dry. If your health care provider says it is okay, leave the incision uncovered. Use a bandage (dressing) if the incision is draining fluid or appears irritated. If the adhesive strips across the incision do not fall off within 7 days, carefully peel them off.   Hug a pillow when coughing or sneezing until your incision is healed. This helps to relieve pain.   Do not use tampons or douche until your health care provider says it is okay.   Shower, wash your hair, and take tub baths as directed by your health care provider.   Wear a well-fitting bra that provides breast support.   Limit wearing support panties or control-top hose.   Drink enough fluids to keep your urine clear or pale yellow.   Eat high-fiber foods such as whole grain cereals and breads, brown rice, beans, and fresh fruits and vegetables every day. These foods may help prevent or relieve constipation.   Resume activities such as climbing stairs,  driving, lifting, exercising, or traveling as directed by your health care provider.   Talk to your health care provider about resuming sexual activities. This is dependent upon your risk of infection, your rate of healing, and your comfort and desire to resume sexual activity.   Try to have someone help you with your household activities and your newborn for at least a few days after you leave the hospital.   Rest as much as possible. Try to rest or take a nap when your newborn is sleeping.   Increase your activities gradually.   Keep all of your scheduled postpartum appointments. It is very important to keep your scheduled follow-up appointments. At these appointments, your health care provider will be checking to make sure that you are healing physically and emotionally.  SEEK MEDICAL CARE IF:    You are passing large clots from your vagina. Save any clots to show your health care provider.   You have a foul smelling discharge from your vagina.   You have trouble urinating.   You are urinating frequently.   You have pain when you urinate.   You have a change in your bowel movements.   You have increasing redness, pain, or swelling near your incision.   You have pus draining from your incision.   Your incision is separating.   You have painful, hard, or reddened breasts.   You have a severe headache.   You have blurred vision or see spots.   You feel sad   or depressed.   You have thoughts of hurting yourself or your newborn.   You have questions about your care, the care of your newborn, or medications.   You are dizzy or light-headed.   You have a rash.   You have pain, redness, or swelling at the site of the removed intravenous access (IV) tube.   You have nausea or vomiting.   You stopped breastfeeding and have not had a menstrual period within 12 weeks of stopping.   You are not breastfeeding and have not had a menstrual period within 12 weeks of delivery.   You have a fever.  SEEK  IMMEDIATE MEDICAL CARE IF:   You have persistent pain.   You have chest pain.   You have shortness of breath.   You faint.   You have leg pain.   You have stomach pain.   Your vaginal bleeding saturates 2 or more sanitary pads in 1 hour.  MAKE SURE YOU:    Understand these instructions.   Will watch your condition.   Will get help right away if you are not doing well or get worse.     This information is not intended to replace advice given to you by your health care provider. Make sure you discuss any questions you have with your health care provider.     Document Released: 11/18/2001 Document Revised: 03/19/2014 Document Reviewed: 10/24/2011  Elsevier Interactive Patient Education 2016 Elsevier Inc[employer]

## 2015-03-31 LAB — SURGICAL PATHOLOGY

## 2015-03-31 MED ORDER — FERROUS FUMARATE 325 (106 FE) MG PO TABS
1.0000 | ORAL_TABLET | Freq: Every day | ORAL | Status: DC
  Administered 2015-03-31 – 2015-04-01 (×2): 106 mg via ORAL
  Filled 2015-03-31 (×2): qty 1

## 2015-03-31 MED ORDER — DOCUSATE SODIUM 100 MG PO CAPS
100.0000 mg | ORAL_CAPSULE | Freq: Every day | ORAL | Status: DC
  Administered 2015-03-31 – 2015-04-01 (×2): 100 mg via ORAL
  Filled 2015-03-31 (×2): qty 1

## 2015-03-31 NOTE — Progress Notes (Signed)
POD #1 s/P Cesarean section for FTP Subjective:   I have been itching since my surgery. Tolerating regular diet. Has not voided since her foley was removed this AM. Passing flatus.   Objective:  Blood pressure 143/74, pulse 86, temperature 97.7 F (36.5 C), temperature source Oral, resp. rate 18, height _0  (1.626 m), weight 105.235 kg (232 lb), SpO2 99 %, unknown if currently breastfeeding.  General: NAD Heart: RRR without murmur Lungs: CTA Pulmonary: no increased work of breathing Abdomen: non-distended, non-tender, fundus firm at level of umbilicus-1 FB Incision: C+D+I with honey comb dressing Extremities: pedal  edema, no erythema, no tenderness. VV present Excellent urine output yesterday (2900 ml in 24 hours)  Results for orders placed or performed during the hospital encounter of 03/28/15 (from the past 72 hour(s))  CBC     Status: Abnormal   Collection Time: 03/28/15  8:28 PM  Result Value Ref Range   WBC 10.5 3.6 - 11.0 K/uL   RBC 4.06 3.80 - 5.20 MIL/uL   Hemoglobin 12.1 12.0 - 16.0 g/dL   HCT 36.9 35.0 - 47.0 %   MCV 91.1 80.0 - 100.0 fL   MCH 29.8 26.0 - 34.0 pg   MCHC 32.7 32.0 - 36.0 g/dL   RDW 15.4 (H) 11.5 - 14.5 %   Platelets 214 150 - 440 K/uL  Type and screen     Status: None   Collection Time: 03/28/15  8:28 PM  Result Value Ref Range   ABO/RH(D) O POS    Antibody Screen NEG    Sample Expiration 03/31/2015   RPR     Status: None   Collection Time: 03/28/15  8:28 PM  Result Value Ref Range   RPR Ser Ql Non Reactive Non Reactive    Comment: (NOTE) Performed At: [facility] [place], [place] [id number] Lindon Romp MD KX:[id number]   Comprehensive metabolic panel     Status: Abnormal   Collection Time: 03/28/15  8:28 PM  Result Value Ref Range   Sodium 136 135 - 145 mmol/L   Potassium 3.8 3.5 - 5.1 mmol/L   Chloride 107 101 - 111 mmol/L   CO2 20 (L) 22 - 32 mmol/L   Glucose, Bld 90 65 - 99 mg/dL   BUN 11 6 - 20 mg/dL    Creatinine, Ser 0.77 0.44 - 1.00 mg/dL   Calcium 9.7 8.9 - 10.3 mg/dL   Total Protein 6.7 6.5 - 8.1 g/dL   Albumin 3.0 (L) 3.5 - 5.0 g/dL   AST 20 15 - 41 U/L   ALT 12 (L) 14 - 54 U/L   Alkaline Phosphatase 170 (H) 38 - 126 U/L   Total Bilirubin 0.4 0.3 - 1.2 mg/dL   GFR calc non Af Amer >60 >60 mL/min   GFR calc Af Amer >60 >60 mL/min    Comment: (NOTE) The eGFR has been calculated using the CKD EPI equation. This calculation has not been validated in all clinical situations. eGFR's persistently <60 mL/min signify possible Chronic Kidney Disease.    Anion gap 9 5 - 15  ABO/Rh     Status: None   Collection Time: 03/28/15  8:29 PM  Result Value Ref Range   ABO/RH(D) O POS   Protein / creatinine ratio, urine     Status: None   Collection Time: 03/29/15  8:30 AM  Result Value Ref Range   Creatinine, Urine 177 mg/dL   Total Protein, Urine 24 mg/dL    Comment:  NO NORMAL RANGE ESTABLISHED FOR THIS TEST   Protein Creatinine Ratio 0.14 0.00 - 0.15 mg/mg[Cre]  CBC     Status: Abnormal   Collection Time: 03/29/15  7:30 PM  Result Value Ref Range   WBC 13.8 (H) 3.6 - 11.0 K/uL   RBC 4.01 3.80 - 5.20 MIL/uL   Hemoglobin 12.3 12.0 - 16.0 g/dL   HCT 37.1 35.0 - 47.0 %   MCV 92.6 80.0 - 100.0 fL   MCH 30.6 26.0 - 34.0 pg   MCHC 33.1 32.0 - 36.0 g/dL   RDW 15.4 (H) 11.5 - 14.5 %   Platelets 211 150 - 440 K/uL  CBC     Status: Abnormal   Collection Time: 03/30/15 10:45 AM  Result Value Ref Range   WBC 20.3 (H) 3.6 - 11.0 K/uL   RBC 3.10 (L) 3.80 - 5.20 MIL/uL   Hemoglobin 9.3 (L) 12.0 - 16.0 g/dL   HCT 28.9 (L) 35.0 - 47.0 %   MCV 93.2 80.0 - 100.0 fL   MCH 30.0 26.0 - 34.0 pg   MCHC 32.2 32.0 - 36.0 g/dL   RDW 15.6 (H) 11.5 - 14.5 %   Platelets 181 150 - 440 K/uL     Assessment:   40 y.o. [id number] postoperativeday # 1-stable  Trial of voiding  Ambulate today  Benadryl for itching   Plan:  1) Acute blood loss anemia - hemodynamically stable and asymptomatic - po iron/  vitamins  2) --/--/O POS (01/16 2029) / RI / Varicella Immune  3) TDAP UTD  4) Breast/ BTL  5) Disposition-POD 3 or 4  Ximenna Fonseca, CNM

## 2015-03-31 NOTE — Anesthesia Post-op Follow-up Note (Signed)
  Anesthesia Pain Follow-up Note  Patient: Marilyn Hamilton  Day #: 1  Date of Follow-up: 03/31/2015 Time: 7:54 AM  Last Vitals:  Filed Vitals:   03/31/15 0431 03/31/15 0741  BP: 120/62 143/74  Pulse: 82 86  Temp: 36.8 C 36.5 C  Resp: 20 18    Level of Consciousness: alert  Pain: none   Side Effects:None  Catheter Site Exam:clean, dry, no drainage  Plan: D/C from anesthesia care  Zachary George

## 2015-03-31 NOTE — Lactation Note (Signed)
This note was copied from the chart of Marilyn Hamilton. Lactation Consultation Note  Patient Name: Marilyn Anwar Crill [id number] Date: 03/31/2015 Reason for consult: Follow-up assessment   Maternal Data Formula Feeding for Exclusion: No Has patient been taught Hand Expression?: Yes Does the patient have breastfeeding experience prior to this delivery?: Yes  Feeding Feeding Type: Breast Fed Length of feed: 15 min  LATCH Score/Interventions Latch: Grasps breast easily, tongue down, lips flanged, rhythmical sucking.  Audible Swallowing: Spontaneous and intermittent  Type of Nipple: Everted at rest and after stimulation  Comfort (Breast/Nipple): Soft / non-tender     Hold (Positioning): Assistance needed to correctly position infant at breast and maintain latch. Intervention(s): Breastfeeding basics reviewed  Baby is rolling the bottom lip under.  LATCH Score: 9  Lactation Tools Discussed/Used     Consult Status      Trudee Grip 03/31/2015, 3:09 PM

## 2015-03-31 NOTE — Anesthesia Postprocedure Evaluation (Signed)
Anesthesia Post Note  Patient: Marilyn Hamilton  Procedure(s) Performed: Procedure(s) (LRB): CESAREAN SECTION, BTL (N/A)  Patient location during evaluation: Mother Baby Anesthesia Type: Spinal Level of consciousness: awake and alert and oriented Pain management: pain level controlled Vital Signs Assessment: post-procedure vital signs reviewed and stable Respiratory status: spontaneous breathing Cardiovascular status: stable Postop Assessment: no headache, no backache, spinal receding, patient able to bend at knees, no signs of nausea or vomiting and adequate PO intake Anesthetic complications: no    Last Vitals:  Filed Vitals:   03/31/15 0431 03/31/15 0741  BP: 120/62 143/74  Pulse: 82 86  Temp: 36.8 C 36.5 C  Resp: 20 18    Last Pain:  Filed Vitals:   03/31/15 0740  PainSc: 3                  Zachary George

## 2015-04-01 LAB — CBC
HEMATOCRIT: 25.5 % — AB (ref 35.0–47.0)
HEMOGLOBIN: 8.3 g/dL — AB (ref 12.0–16.0)
MCH: 30 pg (ref 26.0–34.0)
MCHC: 32.6 g/dL (ref 32.0–36.0)
MCV: 92 fL (ref 80.0–100.0)
Platelets: 194 10*3/uL (ref 150–440)
RBC: 2.77 MIL/uL — ABNORMAL LOW (ref 3.80–5.20)
RDW: 15.3 % — AB (ref 11.5–14.5)
WBC: 12.8 10*3/uL — ABNORMAL HIGH (ref 3.6–11.0)

## 2015-04-01 MED ORDER — IBUPROFEN 600 MG PO TABS: 600.0000 mg | ORAL_TABLET | Freq: Four times a day (QID) | ORAL | Status: DC | PRN

## 2015-04-01 MED ORDER — OXYCODONE-ACETAMINOPHEN 5-325 MG PO TABS: 2.0000 | ORAL_TABLET | ORAL | Status: DC | PRN

## 2015-04-01 MED ORDER — DOCUSATE SODIUM 100 MG PO CAPS: 100.0000 mg | ORAL_CAPSULE | Freq: Every day | ORAL | Status: DC

## 2015-04-01 NOTE — Progress Notes (Signed)
Pt discharged home with infant.  Discharge instructions and follow up appointment given to and reviewed with pt.  Pt verbalized understanding.  Escorted by auxillary. 

## 2015-05-11 LAB — HM PAP SMEAR: HM PAP: NEGATIVE

## 2015-07-18 LAB — HM HIV SCREENING LAB: HM HIV SCREENING: NEGATIVE

## 2015-07-19 ENCOUNTER — Other Ambulatory Visit: Payer: Self-pay | Admitting: Internal Medicine

## 2015-07-19 DIAGNOSIS — R1909 Other intra-abdominal and pelvic swelling, mass and lump: Secondary | ICD-10-CM

## 2015-07-22 ENCOUNTER — Ambulatory Visit: Payer: 59

## 2015-08-15 ENCOUNTER — Ambulatory Visit
Admission: RE | Admit: 2015-08-15 | Discharge: 2015-08-15 | Disposition: A | Discharge status: Home / Self Care | Payer: 59 | Source: Ambulatory Visit | Attending: Internal Medicine | Admitting: Internal Medicine

## 2015-08-15 DIAGNOSIS — R1909 Other intra-abdominal and pelvic swelling, mass and lump: Secondary | ICD-10-CM | POA: Diagnosis present

## 2016-05-14 ENCOUNTER — Other Ambulatory Visit: Payer: Self-pay | Admitting: Internal Medicine

## 2016-05-14 DIAGNOSIS — Z1231 Encounter for screening mammogram for malignant neoplasm of breast: Secondary | ICD-10-CM

## 2016-05-21 ENCOUNTER — Encounter: Payer: Self-pay | Admitting: Internal Medicine

## 2016-06-04 ENCOUNTER — Encounter: Payer: Self-pay | Admitting: Internal Medicine

## 2016-06-08 ENCOUNTER — Ambulatory Visit: Payer: 59 | Attending: Internal Medicine

## 2016-06-25 LAB — HM PAP SMEAR: HM PAP: NEGATIVE

## 2017-07-09 ENCOUNTER — Encounter: Payer: Self-pay | Admitting: Internal Medicine

## 2017-07-09 ENCOUNTER — Ambulatory Visit (INDEPENDENT_AMBULATORY_CARE_PROVIDER_SITE_OTHER): Payer: 59

## 2017-07-09 ENCOUNTER — Ambulatory Visit: Payer: 59 | Admitting: Internal Medicine

## 2017-07-09 VITALS — BP 120/78 | HR 73 | Temp 98.5°F | Ht 64.0 in | Wt 201.4 lb

## 2017-07-09 DIAGNOSIS — R102 Pelvic and perineal pain: Secondary | ICD-10-CM | POA: Diagnosis not present

## 2017-07-09 DIAGNOSIS — E669 Obesity, unspecified: Secondary | ICD-10-CM | POA: Diagnosis not present

## 2017-07-09 DIAGNOSIS — M5441 Lumbago with sciatica, right side: Secondary | ICD-10-CM

## 2017-07-09 DIAGNOSIS — Z1231 Encounter for screening mammogram for malignant neoplasm of breast: Secondary | ICD-10-CM

## 2017-07-09 MED ORDER — OXYCODONE-ACETAMINOPHEN 5-325 MG PO TABS: 1.0000 | ORAL_TABLET | Freq: Two times a day (BID) | ORAL | 0 refills | Status: DC | PRN

## 2017-07-09 MED ORDER — METHOCARBAMOL 750 MG PO TABS: 750.0000 mg | ORAL_TABLET | Freq: Every day | ORAL | 1 refills | Status: DC

## 2017-07-09 NOTE — Progress Notes (Signed)
Chief Complaint  Patient presents with  . Follow-up   Establish former T[facility] pt [facility]  1. C/o low back pain 6-7/10 sharp intermittent worse since Easter. 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 She had trouble turning and lying down and caused pressure in lower back. Her low back has been uncomfortable since 2017 when she had her child and had epidural. She has tried tylenol, Ibuprofen qd. Low back pain was going to right hip and causing numbness/tingling in right toe and down right leg.   She needs a note so her gym will release her while w/u back pain being pursued and doesn't have to pay per month  2. C/o pelvic pain with sex and also around menses LMP 07/01/17. She reports she had a CT scan since our last visit + diverticulitis  3. C/w wt gain and wants to lose wt she and husband changed their diet around and tried to eat better exercise limited due to #1  4. She wants mammogram    Review of Systems  Constitutional: Negative for weight loss.  HENT: Negative for hearing loss.   Eyes: Negative for blurred vision.  Respiratory: Negative for shortness of breath.   Cardiovascular: Negative for chest pain.  Gastrointestinal: Negative for abdominal pain.  Genitourinary:       Female pelvic pain   Musculoskeletal: Positive for back pain. Negative for joint pain.  Skin: Negative for rash.  Neurological: Negative for headaches.  Psychiatric/Behavioral: Negative for depression.   Past Medical History:  Diagnosis Date  . AMA (advanced maternal age) multigravida 35+   . Chicken pox   . Diverticulitis   . Low back pain   . UTI (urinary tract infection)    Past Surgical History:  Procedure Laterality Date  . CESAREAN SECTION N/A 03/30/2015   Procedure: CESAREAN SECTION, BTL;  Surgeon: Nadara Mustard, MD;  Location: [facility] ORS;  Service: Obstetrics;  Laterality: N/A;  . NO PAST SURGERIES     Family History  Problem Relation Age of Onset  . Depression Mother   . Hypertension Mother   .  Hypertension Father   . Alcohol abuse Father   . Heart disease Maternal Grandmother   . Hypertension Maternal Grandmother   . Hypertension Maternal Grandfather   . Cancer Maternal Grandfather    Social History   Socioeconomic History  . Marital status: Married    Spouse name: Not on file  . Number of children: Not on file  . Years of education: Not on file  . Highest education level: Not on file  Occupational History  . Not on file  Social Needs  . Financial resource strain: Not on file  . Food insecurity:    Worry: Not on file    Inability: Not on file  . Transportation needs:    Medical: Not on file    Non-medical: Not on file  Tobacco Use  . Smoking status: Never Smoker  . Smokeless tobacco: Never Used  Substance and Sexual Activity  . Alcohol use: No  . Drug use: No  . Sexual activity: Yes  Lifestyle  . Physical activity:    Days per week: Not on file    Minutes per session: Not on file  . Stress: Not on file  Relationships  . Social connections:    Talks on phone: Not on file    Gets together: Not on file    Attends religious service: Not on file    Active member of club or  organization: Not on file    Attends meetings of clubs or organizations: Not on file    Relationship status: Not on file  . Intimate partner violence:    Fear of current or ex partner: Not on file    Emotionally abused: Not on file    Physically abused: Not on file    Forced sexual activity: Not on file  Other Topics Concern  . Not on file  Social History Narrative   Married    3 kids 77 y.o, 40 y.o and 2 y.o as of 06/2017    Never smoker    Wears selt belt, safe in relationship    No guns at home    Hair stylist    Current Meds  Medication Sig  . PROAIR HFA 108 (90 Base) MCG/ACT inhaler    No Known Allergies No results found for this or any previous visit (from the past 2160 hour(s)). Objective  Body mass index is 34.57 kg/m. Wt Readings from Last 3 Encounters:  07/09/17  201 lb 6.4 oz (91.4 kg)  03/28/15 232 lb (105.2 kg)  03/12/15 225 lb (102.1 kg)   Temp Readings from Last 3 Encounters:  07/09/17 98.5 F (36.9 C) (Oral)  04/01/15 98 F (36.7 C) (Oral)  03/12/15 98.4 F (36.9 C) (Oral)   BP Readings from Last 3 Encounters:  07/09/17 120/78  04/01/15 139/69  03/12/15 (!) 119/59   Pulse Readings from Last 3 Encounters:  07/09/17 73  04/01/15 78  03/12/15 83    Physical Exam  Constitutional: She is oriented to person, place, and time. Vital signs are normal. She appears well-developed and well-nourished. She is cooperative.  HENT:  Head: Normocephalic and atraumatic.  Mouth/Throat: Oropharynx is clear and moist and mucous membranes are normal.  Eyes: Pupils are equal, round, and reactive to light. Conjunctivae are normal.  Cardiovascular: Normal rate, regular rhythm and normal heart sounds.  Pulmonary/Chest: Effort normal and breath sounds normal.  Musculoskeletal:       Lumbar back: She exhibits tenderness.  Neg str8 leg test b/l   Neurological: She is alert and oriented to person, place, and time. Gait normal.  Skin: Skin is warm, dry and intact.  Psychiatric: She has a normal mood and affect. Her speech is normal and behavior is normal. Judgment and thought content normal. Cognition and memory are normal.  Nursing note and vitals reviewed.   Assessment   1. Low back pain with right radiculopathy w/o alarm sx's 2. Female pelvic pain with intercourse  3. Obesity BMI >34 4. HM Plan  1.  Xray today  Prn percocet and robaxin  Consider PT and MRI low back  Prn OTC meds  Prn heat, back stretches 2.  Consider OB/GYN in future  3. Disc healthy diet choices and exercise walking for now disc water aerobics pt cant swim  4.  Did not have flu shot  Tdap  ? Hep B status   Review records [facility] I.e pap  Referred for mammogram today  [facility]ess [facility] records imaging, labs, notes, CT scan ? Diverticulitis  Provider: Dr. French Ana  [facility]

## 2017-07-09 NOTE — Patient Instructions (Signed)
Try 1/2 to 1 pill of Percocet as needed at night for back pain with  Robaxin muscle relaxer  And heat You can continue Tylenol but do not take more than 3000 mg total in 1 day  As needed Advil no more than 2400 mg daily   Back Pain, Adult Many adults have back pain from time to time. Common causes of back pain include:  A strained muscle or ligament.  Wear and tear (degeneration) of the spinal disks.  Arthritis.  A hit to the back.  Back pain can be short-lived (acute) or last a long time (chronic). A physical exam, lab tests, and imaging studies may be done to find the cause of your pain. Follow these instructions at home: Managing pain and stiffness  Take over-the-counter and prescription medicines only as told by your health care provider.  If directed, apply heat to the affected area as often as told by your health care provider. Use the heat source that your health care provider recommends, such as a moist heat pack or a heating pad. ? Place a towel between your skin and the heat source. ? Leave the heat on for 20-30 minutes. ? Remove the heat if your skin turns bright red. This is especially important if you are unable to feel pain, heat, or cold. You have a greater risk of getting burned.  If directed, apply ice to the injured area: ? Put ice in a plastic bag. ? Place a towel between your skin and the bag. ? Leave the ice on for 20 minutes, 2-3 times a day for the first 2-3 days. Activity  Do not stay in bed. Resting more than 1-2 days can delay your recovery.  Take short walks on even surfaces as soon as you are able. Try to increase the length of time you walk each day.  Do not sit, drive, or stand in one place for more than 30 minutes at a time. Sitting or standing for long periods of time can put stress on your back.  Use proper lifting techniques. When you bend and lift, use positions that put less stress on your back: ? Benson your knees. ? Keep the load close to  your body. ? Avoid twisting.  Exercise regularly as told by your health care provider. Exercising will help your back heal faster. This also helps prevent back injuries by keeping muscles strong and flexible.  Your health care provider may recommend that you see a physical therapist. This person can help you come up with a safe exercise program. Do any exercises as told by your physical therapist. Lifestyle  Maintain a healthy weight. Extra weight puts stress on your back and makes it difficult to have good posture.  Avoid activities or situations that make you feel anxious or stressed. Learn ways to manage anxiety and stress. One way to manage stress is through exercise. Stress and anxiety increase muscle tension and can make back pain worse. General instructions  Sleep on a firm mattress in a comfortable position. Try lying on your side with your knees slightly bent. If you lie on your back, put a pillow under your knees.  Follow your treatment plan as told by your health care provider. This may include: ? Cognitive or behavioral therapy. ? Acupuncture or massage therapy. ? Meditation or yoga. Contact a health care provider if:  You have pain that is not relieved with rest or medicine.  You have increasing pain going down into your legs or buttocks.  Your pain does not improve in 2 weeks.  You have pain at night.  You lose weight.  You have a fever or chills. Get help right away if:  You develop new bowel or bladder control problems.  You have unusual weakness or numbness in your arms or legs.  You develop nausea or vomiting.  You develop abdominal pain.  You feel faint. Summary  Many adults have back pain from time to time. A physical exam, lab tests, and imaging studies may be done to find the cause of your pain.  Use proper lifting techniques. When you bend and lift, use positions that put less stress on your back.  Take over-the-counter and prescription  medicines and apply heat or ice as directed by your health care provider. This information is not intended to replace advice given to you by your health care provider. Make sure you discuss any questions you have with your health care provider. Document Released: 02/26/2005 Document Revised: 04/02/2016 Document Reviewed: 04/02/2016 Elsevier Interactive Patient Education  [employer].

## 2017-07-09 NOTE — Progress Notes (Signed)
Pre visit review using our clinic review tool, if applicable. No additional management support is needed unless otherwise documented below in the visit note. 

## 2017-07-17 ENCOUNTER — Other Ambulatory Visit: Payer: Self-pay | Admitting: Internal Medicine

## 2017-07-17 DIAGNOSIS — M5416 Radiculopathy, lumbar region: Secondary | ICD-10-CM

## 2017-07-29 ENCOUNTER — Ambulatory Visit: Admission: RE | Admit: 2017-07-29 | Payer: 59 | Source: Ambulatory Visit

## 2017-07-30 ENCOUNTER — Telehealth: Payer: Self-pay

## 2017-07-30 NOTE — Telephone Encounter (Signed)
pls call pt to resch  TMS

## 2017-07-30 NOTE — Telephone Encounter (Signed)
Copied from CRM [id number]. Topic: Referral - Status >> Jul 30, 2017 10:11 AM Crist Infante wrote: Reason for CRM: Clydie Braun with [facility] would like the dr to know pt was a no show for her MR lumbar spine wo contrast last night

## 2017-08-12 ENCOUNTER — Ambulatory Visit: Payer: 59 | Admitting: Internal Medicine

## 2017-08-12 ENCOUNTER — Encounter: Payer: Self-pay | Admitting: Internal Medicine

## 2017-08-12 VITALS — BP 110/78 | HR 73 | Temp 98.5°F | Ht 64.0 in | Wt 200.0 lb

## 2017-08-12 DIAGNOSIS — Z0000 Encounter for general adult medical examination without abnormal findings: Secondary | ICD-10-CM

## 2017-08-12 DIAGNOSIS — R590 Localized enlarged lymph nodes: Secondary | ICD-10-CM | POA: Diagnosis not present

## 2017-08-12 DIAGNOSIS — G8929 Other chronic pain: Secondary | ICD-10-CM | POA: Insufficient documentation

## 2017-08-12 DIAGNOSIS — M5441 Lumbago with sciatica, right side: Secondary | ICD-10-CM

## 2017-08-12 DIAGNOSIS — Z1159 Encounter for screening for other viral diseases: Secondary | ICD-10-CM | POA: Diagnosis not present

## 2017-08-12 DIAGNOSIS — E559 Vitamin D deficiency, unspecified: Secondary | ICD-10-CM | POA: Diagnosis not present

## 2017-08-12 DIAGNOSIS — Z1389 Encounter for screening for other disorder: Secondary | ICD-10-CM

## 2017-08-12 DIAGNOSIS — Z0184 Encounter for antibody response examination: Secondary | ICD-10-CM | POA: Diagnosis not present

## 2017-08-12 DIAGNOSIS — Z13220 Encounter for screening for lipoid disorders: Secondary | ICD-10-CM | POA: Diagnosis not present

## 2017-08-12 DIAGNOSIS — R739 Hyperglycemia, unspecified: Secondary | ICD-10-CM | POA: Diagnosis not present

## 2017-08-12 DIAGNOSIS — R5383 Other fatigue: Secondary | ICD-10-CM | POA: Diagnosis not present

## 2017-08-12 NOTE — Patient Instructions (Addendum)
Please schedule fasting labs Monday here at check out  Follow up in 4-6 months with me  I will look for CT scan from [facility]  Please sch your mammogram [place]  Salonpas or Lidocaine over the counter are other topical options for pain    Back Pain, Adult Many adults have back pain from time to time. Common causes of back pain include:  A strained muscle or ligament.  Wear and tear (degeneration) of the spinal disks.  Arthritis.  A hit to the back.  Back pain can be short-lived (acute) or last a long time (chronic). A physical exam, lab tests, and imaging studies may be done to find the cause of your pain. Follow these instructions at home: Managing pain and stiffness  Take over-the-counter and prescription medicines only as told by your health care provider.  If directed, apply heat to the affected area as often as told by your health care provider. Use the heat source that your health care provider recommends, such as a moist heat pack or a heating pad. ? Place a towel between your skin and the heat source. ? Leave the heat on for 20-30 minutes. ? Remove the heat if your skin turns bright red. This is especially important if you are unable to feel pain, heat, or cold. You have a greater risk of getting burned.  If directed, apply ice to the injured area: ? Put ice in a plastic bag. ? Place a towel between your skin and the bag. ? Leave the ice on for 20 minutes, 2-3 times a day for the first 2-3 days. Activity  Do not stay in bed. Resting more than 1-2 days can delay your recovery.  Take short walks on even surfaces as soon as you are able. Try to increase the length of time you walk each day.  Do not sit, drive, or stand in one place for more than 30 minutes at a time. Sitting or standing for long periods of time can put stress on your back.  Use proper lifting techniques. When you bend and lift, use positions that put less stress on your back: ? [place] your  knees. ? Keep the load close to your body. ? Avoid twisting.  Exercise regularly as told by your health care provider. Exercising will help your back heal faster. This also helps prevent back injuries by keeping muscles strong and flexible.  Your health care provider may recommend that you see a physical therapist. This person can help you come up with a safe exercise program. Do any exercises as told by your physical therapist. Lifestyle  Maintain a healthy weight. Extra weight puts stress on your back and makes it difficult to have good posture.  Avoid activities or situations that make you feel anxious or stressed. Learn ways to manage anxiety and stress. One way to manage stress is through exercise. Stress and anxiety increase muscle tension and can make back pain worse. General instructions  Sleep on a firm mattress in a comfortable position. Try lying on your side with your knees slightly bent. If you lie on your back, put a pillow under your knees.  Follow your treatment plan as told by your health care provider. This may include: ? Cognitive or behavioral therapy. ? Acupuncture or massage therapy. ? Meditation or yoga. Contact a health care provider if:  You have pain that is not relieved with rest or medicine.  You have increasing pain going down into your legs or buttocks.  Your  pain does not improve in 2 weeks.  You have pain at night.  You lose weight.  You have a fever or chills. Get help right away if:  You develop new bowel or bladder control problems.  You have unusual weakness or numbness in your arms or legs.  You develop nausea or vomiting.  You develop abdominal pain.  You feel faint. Summary  Many adults have back pain from time to time. A physical exam, lab tests, and imaging studies may be done to find the cause of your pain.  Use proper lifting techniques. When you bend and lift, use positions that put less stress on your back.  Take  over-the-counter and prescription medicines and apply heat or ice as directed by your health care provider. This information is not intended to replace advice given to you by your health care provider. Make sure you discuss any questions you have with your health care provider. Document Released: 02/26/2005 Document Revised: 04/02/2016 Document Reviewed: 04/02/2016 Elsevier Interactive Patient Education  2018 [employer]. Arthritis Arthritis is a term that is commonly used to refer to joint pain or joint disease. There are more than 100 types of arthritis. What are the causes? The most common cause of this condition is wear and tear of a joint. Other causes include:  Gout.  Inflammation of a joint.  An infection of a joint.  Sprains and other injuries near the joint.  A drug reaction or allergic reaction.  In some cases, the cause may not be known. What are the signs or symptoms? The main symptom of this condition is pain in the joint with movement. Other symptoms include:  Redness, swelling, or stiffness at a joint.  Warmth coming from the joint.  Fever.  Overall feeling of illness.  How is this diagnosed? This condition may be diagnosed with a physical exam and tests, including:  Blood tests.  Urine tests.  Imaging tests, such as MRI, X-rays, or a CT scan.  Sometimes, fluid is removed from a joint for testing. How is this treated? Treatment for this condition may involve:  Treatment of the cause, if it is known.  Rest.  Raising (elevating) the joint.  Applying cold or hot packs to the joint.  Medicines to improve symptoms and reduce inflammation.  Injections of a steroid such as cortisone into the joint to help reduce pain and inflammation.  Depending on the cause of your arthritis, you may need to make lifestyle changes to reduce stress on your joint. These changes may include exercising more and losing weight. Follow these instructions at  home: Medicines  Take over-the-counter and prescription medicines only as told by your health care provider.  Do not take aspirin to relieve pain if gout is suspected. Activity  Rest your joint if told by your health care provider. Rest is important when your disease is active and your joint feels painful, swollen, or stiff.  Avoid activities that make the pain worse. It is important to balance activity with rest.  Exercise your joint regularly with range-of-motion exercises as told by your health care provider. Try doing low-impact exercise, such as: ? Swimming. ? Water aerobics. ? Biking. ? Walking. Joint Care   If your joint is swollen, keep it elevated if told by your health care provider.  If your joint feels stiff in the morning, try taking a warm shower.  If directed, apply heat to the joint. If you have diabetes, do not apply heat without permission from your health  care provider. ? Put a towel between the joint and the hot pack or heating pad. ? Leave the heat on the area for 20-30 minutes.  If directed, apply ice to the joint: ? Put ice in a plastic bag. ? Place a towel between your skin and the bag. ? Leave the ice on for 20 minutes, 2-3 times per day.  Keep all follow-up visits as told by your health care provider. This is important. Contact a health care provider if:  The pain gets worse.  You have a fever. Get help right away if:  You develop severe joint pain, swelling, or redness.  Many joints become painful and swollen.  You develop severe back pain.  You develop severe weakness in your leg.  You cannot control your bladder or bowels. This information is not intended to replace advice given to you by your health care provider. Make sure you discuss any questions you have with your health care provider. Document Released: 04/05/2004 Document Revised: 08/04/2015 Document Reviewed: 05/24/2014 Elsevier Interactive Patient Education  [employer]2018 Elsevier  Inc.

## 2017-08-12 NOTE — Progress Notes (Addendum)
Chief Complaint  Patient presents with  . Follow-up   F/u  1. Low back pain improved with aleve and aspercream. Back in am is stiff. She no longer taking robaxin or percocet. Xray with degenerative changes low back. Pain would radiated right lower back and right hip. Pt did not get MRI of low back done  2. 4 mm left groin lymph node we need to get copy of CT ab/pelvis [facility]  3. C/o fatigue at times    Review of Systems  Constitutional: Negative for weight loss.  HENT: Negative for hearing loss.   Eyes: Negative for blurred vision.  Respiratory: Negative for shortness of breath.   Cardiovascular: Negative for chest pain.  Gastrointestinal: Negative for heartburn.  Genitourinary:       Left groin pain intermittently   Musculoskeletal: Negative for back pain.  Skin: Negative for rash.  Neurological: Negative for headaches.  Psychiatric/Behavioral: Negative for depression.   Past Medical History:  Diagnosis Date  . AMA (advanced maternal age) multigravida 35+   . Chicken pox   . Diverticulitis   . Low back pain   . UTI (urinary tract infection)    Past Surgical History:  Procedure Laterality Date  . CESAREAN SECTION N/A 03/30/2015   Procedure: CESAREAN SECTION, BTL;  Surgeon: Nadara Mustard, MD;  Location: [facility] ORS;  Service: Obstetrics;  Laterality: N/A;  . NO PAST SURGERIES     Family History  Problem Relation Age of Onset  . Depression Mother   . Hypertension Mother   . Hypertension Father   . Alcohol abuse Father   . Heart disease Maternal Grandmother   . Hypertension Maternal Grandmother   . Hypertension Maternal Grandfather   . Cancer Maternal Grandfather    Social History   Socioeconomic History  . Marital status: Married    Spouse name: Not on file  . Number of children: Not on file  . Years of education: Not on file  . Highest education level: Not on file  Occupational History  . Not on file  Social Needs  . Financial resource strain: Not on file   . Food insecurity:    Worry: Not on file    Inability: Not on file  . Transportation needs:    Medical: Not on file    Non-medical: Not on file  Tobacco Use  . Smoking status: Never Smoker  . Smokeless tobacco: Never Used  Substance and Sexual Activity  . Alcohol use: No  . Drug use: No  . Sexual activity: Yes  Lifestyle  . Physical activity:    Days per week: Not on file    Minutes per session: Not on file  . Stress: Not on file  Relationships  . Social connections:    Talks on phone: Not on file    Gets together: Not on file    Attends religious service: Not on file    Active member of club or organization: Not on file    Attends meetings of clubs or organizations: Not on file    Relationship status: Not on file  . Intimate partner violence:    Fear of current or ex partner: Not on file    Emotionally abused: Not on file    Physically abused: Not on file    Forced sexual activity: Not on file  Other Topics Concern  . 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    Current Meds  Medication Sig  . methocarbamol (ROBAXIN) 750 MG tablet Take 1 tablet (750 mg total) by mouth at bedtime.  Marland Kitchen. oxyCODONE-acetaminophen (PERCOCET) 5-325 MG tablet Take 1 tablet by mouth 2 (two) times daily as needed for severe pain (low back pain).  Marland Kitchen. PROAIR HFA 108 (90 Base) MCG/ACT inhaler    No Known Allergies No results found for this or any previous visit (from the past 2160 hour(s)). Objective  Body mass index is 34.33 kg/m. Wt Readings from Last 3 Encounters:  08/12/17 200 lb (90.7 kg)  07/09/17 201 lb 6.4 oz (91.4 kg)  03/28/15 232 lb (105.2 kg)   Temp Readings from Last 3 Encounters:  08/12/17 98.5 F (36.9 C) (Oral)  07/09/17 98.5 F (36.9 C) (Oral)  04/01/15 98 F (36.7 C) (Oral)   BP Readings from Last 3  Encounters:  08/12/17 110/78  07/09/17 120/78  04/01/15 139/69   Pulse Readings from Last 3 Encounters:  08/12/17 73  07/09/17 73  04/01/15 78    Physical Exam  Constitutional: She is oriented to person, place, and time. Vital signs are normal. She appears well-developed and well-nourished. She is cooperative.  HENT:  Head: Normocephalic and atraumatic.  Mouth/Throat: Oropharynx is clear and moist and mucous membranes are normal.  Eyes: Pupils are equal, round, and reactive to light. Conjunctivae are normal.  Cardiovascular: Normal rate, regular rhythm and normal heart sounds.  Pulmonary/Chest: Effort normal and breath sounds normal.  Musculoskeletal: She exhibits tenderness.       Lumbar back: She exhibits tenderness.  Neurological: She is alert and oriented to person, place, and time. Gait normal.  Skin: Skin is warm, dry and intact.  Psychiatric: She has a normal mood and affect. Her speech is normal and behavior is normal. Judgment and thought content normal. Cognition and memory are normal.  Nursing note and vitals reviewed.   Assessment   1. Low back pain with radiculopathy right  2. H/o 4 mm left groin lymph node  3. Fatigue  4. HM Plan   1.  Hold mri for now  Topical creams otc  Prn nsaids which helped  Stopped robaxin and percocet  2.  Get CT ab/pelvis  3. Check labs  4.  Did not have flu shot prev declined  Tdap had 01/25/15  ? Hep B status  Pap had Dr. Tiburcio PeaHarris 05/11/15 neg pap neg HPV and [facility] 06/25/16 neg pap neg HPV   Labs sch Monday fasting  Need to get [facility]  Referred mammogram   Need [facility] records CT ab/pelvis, pap  -reviewed labs 05/11/16   Reviewed records [facility] 06/21/15 UTI Citrobacter Koseri A1C 5.8 07/18/15  HIV neg 07/18/15 ANA + titer 1:40 07/18/15  Pap 06/25/16 negative neg HPV  Xray lumbar 05/21/16 negative  CT ab pelvis 06/04/16 mild diverticulosis physiologic fluid in cul de sac no inguinal hernia or inguinal lymph  node identified.     Provider: Dr. French Anaracy McLean-Scocuzza-Internal Medicine

## 2017-08-12 NOTE — Progress Notes (Signed)
Pre visit review using our clinic review tool, if applicable. No additional management support is needed unless otherwise documented below in the visit note. 

## 2017-08-15 ENCOUNTER — Telehealth: Payer: Self-pay | Admitting: Internal Medicine

## 2017-08-15 NOTE — Telephone Encounter (Signed)
Copied from CRM [id number]. Topic: Quick Communication - Rx Refill/Question >> Aug 15, 2017  1:43 PM Louie BunPalacios Medina, Rosey Batheresa D wrote: Medication: PROAIR HFA 108 (90 Base) MCG/ACT inhaler  Has the patient contacted their pharmacy? Yes (Agent: If no, request that the patient contact the pharmacy for the refill.) (Agent: If yes, when and what did the pharmacy advise?)  Preferred Pharmacy (with phone number or street name): [facility] - [place], [place] - [address]  Agent: Please be advised that RX refills may take up to 3 business days. We ask that you follow-up with your pharmacy.

## 2017-08-15 NOTE — Telephone Encounter (Signed)
Proair HFA inhaler refill Last Refilled by historical provider  PCP: Dr.McLean-Scocuzza Pharmacy:[facility]

## 2017-08-16 ENCOUNTER — Telehealth: Payer: Self-pay

## 2017-08-16 ENCOUNTER — Other Ambulatory Visit: Payer: Self-pay | Admitting: Internal Medicine

## 2017-08-16 DIAGNOSIS — J4520 Mild intermittent asthma, uncomplicated: Secondary | ICD-10-CM

## 2017-08-16 MED ORDER — PROAIR HFA 108 (90 BASE) MCG/ACT IN AERS: 1.0000 | INHALATION_SPRAY | Freq: Four times a day (QID) | RESPIRATORY_TRACT | 11 refills | Status: DC | PRN

## 2017-08-16 NOTE — Telephone Encounter (Signed)
Please advise 

## 2017-08-16 NOTE — Telephone Encounter (Signed)
Ok to fill 

## 2017-08-16 NOTE — Telephone Encounter (Signed)
Ok to refill pro air 

## 2017-08-16 NOTE — Telephone Encounter (Signed)
Copied from CRM [id number]. Topic: Quick Communication - Rx Refill/Question >> Aug 15, 2017  1:43 PM Marilyn Hamilton, Marilyn Hamilton wrote: Medication: PROAIR HFA 108 (90 Base) MCG/ACT inhaler  Has the patient contacted their pharmacy? Yes (Agent: If no, request that the patient contact the pharmacy for the refill.) (Agent: If yes, when and what did the pharmacy advise?)  Preferred Pharmacy (with phone number or street name): [facility] - [place], [place] - [address]  Agent: Please be advised that RX refills may take up to 3 business days. We ask that you follow-up with your pharmacy. >> Aug 16, 2017 10:51 AM [name], [name] wrote: Pt is checking on status of proair refill she states it has been since Monday

## 2017-08-19 ENCOUNTER — Other Ambulatory Visit: Payer: 59

## 2017-08-19 DIAGNOSIS — E559 Vitamin D deficiency, unspecified: Secondary | ICD-10-CM

## 2017-08-19 DIAGNOSIS — R739 Hyperglycemia, unspecified: Secondary | ICD-10-CM

## 2017-08-19 DIAGNOSIS — Z0184 Encounter for antibody response examination: Secondary | ICD-10-CM

## 2017-08-19 DIAGNOSIS — Z1159 Encounter for screening for other viral diseases: Secondary | ICD-10-CM

## 2017-08-19 DIAGNOSIS — R5383 Other fatigue: Secondary | ICD-10-CM

## 2017-08-19 DIAGNOSIS — Z0000 Encounter for general adult medical examination without abnormal findings: Secondary | ICD-10-CM

## 2017-08-19 DIAGNOSIS — Z1389 Encounter for screening for other disorder: Secondary | ICD-10-CM

## 2017-08-19 DIAGNOSIS — Z13220 Encounter for screening for lipoid disorders: Secondary | ICD-10-CM

## 2017-08-19 NOTE — Addendum Note (Signed)
Addended by: Penne LashWIGGINS, Marchetta Navratil N on: 08/19/2017 08:35 AM   Modules accepted: Orders

## 2017-08-20 ENCOUNTER — Other Ambulatory Visit: Payer: Self-pay | Admitting: Internal Medicine

## 2017-08-20 DIAGNOSIS — E559 Vitamin D deficiency, unspecified: Secondary | ICD-10-CM

## 2017-08-20 LAB — CBC WITH DIFFERENTIAL/PLATELET
BASOS ABS: 18 {cells}/uL (ref 0–200)
Basophils Relative: 0.3 %
EOS ABS: 142 {cells}/uL (ref 15–500)
Eosinophils Relative: 2.4 %
HEMATOCRIT: 33.8 % — AB (ref 35.0–45.0)
HEMOGLOBIN: 11.3 g/dL — AB (ref 11.7–15.5)
LYMPHS ABS: 1876 {cells}/uL (ref 850–3900)
MCH: 30.1 pg (ref 27.0–33.0)
MCHC: 33.4 g/dL (ref 32.0–36.0)
MCV: 89.9 fL (ref 80.0–100.0)
MPV: 12.4 fL (ref 7.5–12.5)
Monocytes Relative: 6.6 %
Neutro Abs: 3475 cells/uL (ref 1500–7800)
Neutrophils Relative %: 58.9 %
Platelets: 215 10*3/uL (ref 140–400)
RBC: 3.76 10*6/uL — ABNORMAL LOW (ref 3.80–5.10)
RDW: 12.8 % (ref 11.0–15.0)
Total Lymphocyte: 31.8 %
WBC: 5.9 10*3/uL (ref 3.8–10.8)
WBCMIX: 389 {cells}/uL (ref 200–950)

## 2017-08-20 LAB — VITAMIN D 25 HYDROXY (VIT D DEFICIENCY, FRACTURES): Vit D, 25-Hydroxy: 17 ng/mL — ABNORMAL LOW (ref 30–100)

## 2017-08-20 LAB — COMPREHENSIVE METABOLIC PANEL
AG Ratio: 1.5 (calc) (ref 1.0–2.5)
ALT: 8 U/L (ref 6–29)
AST: 10 U/L (ref 10–30)
Albumin: 4 g/dL (ref 3.6–5.1)
Alkaline phosphatase (APISO): 73 U/L (ref 33–115)
BILIRUBIN TOTAL: 0.3 mg/dL (ref 0.2–1.2)
BUN: 14 mg/dL (ref 7–25)
CALCIUM: 9.1 mg/dL (ref 8.6–10.2)
CHLORIDE: 106 mmol/L (ref 98–110)
CO2: 25 mmol/L (ref 20–32)
Creat: 0.65 mg/dL (ref 0.50–1.10)
GLOBULIN: 2.7 g/dL (ref 1.9–3.7)
Glucose, Bld: 106 mg/dL — ABNORMAL HIGH (ref 65–99)
Potassium: 4.1 mmol/L (ref 3.5–5.3)
SODIUM: 138 mmol/L (ref 135–146)
TOTAL PROTEIN: 6.7 g/dL (ref 6.1–8.1)

## 2017-08-20 LAB — URINALYSIS, ROUTINE W REFLEX MICROSCOPIC
Bilirubin, UA: NEGATIVE
GLUCOSE, UA: NEGATIVE
KETONES UA: NEGATIVE
Leukocytes, UA: NEGATIVE
NITRITE UA: NEGATIVE
PROTEIN UA: NEGATIVE
RBC, UA: NEGATIVE
UUROB: 0.2 mg/dL (ref 0.2–1.0)
pH, UA: 6 (ref 5.0–7.5)

## 2017-08-20 LAB — T4, FREE: FREE T4: 1 ng/dL (ref 0.8–1.8)

## 2017-08-20 LAB — LIPID PANEL
CHOLESTEROL: 146 mg/dL (ref <200)
HDL: 44 mg/dL — AB (ref >50)
LDL Cholesterol (Calc): 89 mg/dL (calc)
Non-HDL Cholesterol (Calc): 102 mg/dL (calc) (ref <130)
Total CHOL/HDL Ratio: 3.3 (calc) (ref <5.0)
Triglycerides: 43 mg/dL (ref <150)

## 2017-08-20 LAB — HEMOGLOBIN A1C
Hgb A1c MFr Bld: 5.6 % of total Hgb (ref <5.7)
Mean Plasma Glucose: 114 (calc)
eAG (mmol/L): 6.3 (calc)

## 2017-08-20 LAB — MEASLES/MUMPS/RUBELLA IMMUNITY
Mumps IgG: 154 AU/mL
Rubella: 3.23 index
Rubeola IgG: 30.8 AU/mL

## 2017-08-20 LAB — HEPATITIS B SURFACE ANTIBODY, QUANTITATIVE: Hepatitis B-Post: <5 m[IU]/mL — ABNORMAL LOW (ref >=10)

## 2017-08-20 LAB — TSH: TSH: 1.6 m[IU]/L

## 2017-08-20 MED ORDER — CHOLECALCIFEROL 1.25 MG (50000 UT) PO CAPS: 50000.0000 [IU] | ORAL_CAPSULE | ORAL | 1 refills | Status: DC

## 2017-08-21 ENCOUNTER — Encounter: Payer: Self-pay | Admitting: Internal Medicine

## 2017-08-21 NOTE — Progress Notes (Signed)
Pap neg 05/11/15 neg pap neg HPV   TMS

## 2017-08-21 NOTE — Telephone Encounter (Signed)
-----   Message from Bevelyn Bucklesracy N McLean-Scocuzza, MD sent at 08/20/2017  8:13 AM EDT ----- I need CT ab/pelvis from [facility]   TMS

## 2017-08-21 NOTE — Telephone Encounter (Signed)
Paperwork has been faxed again.  

## 2017-09-05 ENCOUNTER — Telehealth: Payer: Self-pay | Admitting: Internal Medicine

## 2017-09-05 NOTE — Telephone Encounter (Signed)
Call pt [facility] records   CT ab pelvis 06/04/16 mild diverticulosis NO inguinal hernia or inguinal lymph node identified.    TMS

## 2017-09-11 NOTE — Telephone Encounter (Signed)
Left message for patient to return call back. [facility] may give results below to patient.   CT abdominal and pelvis 06/04/16  mild diverticulosis NO inguinal hernia or inguinal lymph node identified.

## 2018-02-11 ENCOUNTER — Encounter: Payer: Self-pay | Admitting: Internal Medicine

## 2018-02-11 ENCOUNTER — Ambulatory Visit (INDEPENDENT_AMBULATORY_CARE_PROVIDER_SITE_OTHER): Payer: 59 | Admitting: Internal Medicine

## 2018-02-11 VITALS — BP 122/72 | HR 78 | Temp 98.5°F | Ht 64.0 in | Wt 207.8 lb

## 2018-02-11 DIAGNOSIS — M545 Low back pain, unspecified: Secondary | ICD-10-CM

## 2018-02-11 DIAGNOSIS — E669 Obesity, unspecified: Secondary | ICD-10-CM | POA: Diagnosis not present

## 2018-02-11 DIAGNOSIS — Z0000 Encounter for general adult medical examination without abnormal findings: Secondary | ICD-10-CM | POA: Diagnosis not present

## 2018-02-11 DIAGNOSIS — N644 Mastodynia: Secondary | ICD-10-CM | POA: Diagnosis not present

## 2018-02-11 DIAGNOSIS — E559 Vitamin D deficiency, unspecified: Secondary | ICD-10-CM

## 2018-02-11 MED ORDER — PHENTERMINE HCL 15 MG PO TBDP: 1.0000 | ORAL_TABLET | ORAL | 0 refills | Status: DC

## 2018-02-11 NOTE — Progress Notes (Signed)
Pre visit review using our clinic review tool, if applicable. No additional management support is needed unless otherwise documented below in the visit note. 

## 2018-02-11 NOTE — Progress Notes (Signed)
Chief Complaint  Patient presents with  . Annual Exam   Annual  1. Obesity BMI >35 she wants to lose weight working out less and trying to eat healthy. Exercise limited 2/2 back pain Xray 06/2017 with facet deg changes right low back  2. Vitamin D def   Review of Systems  Constitutional: Negative for weight loss.  HENT: Negative for hearing loss.   Eyes: Negative for blurred vision.  Respiratory: Negative for shortness of breath.   Cardiovascular: Negative for chest pain.  Gastrointestinal: Negative for abdominal pain.  Musculoskeletal: Positive for back pain.  Skin: Negative for rash.  Neurological: Negative for headaches.  Psychiatric/Behavioral: Negative for depression.   Past Medical History:  Diagnosis Date  . AMA (advanced maternal age) multigravida 18+   . Chicken pox   . Diverticulitis   . Low back pain   . UTI (urinary tract infection)    Past Surgical History:  Procedure Laterality Date  . CESAREAN SECTION N/A 03/30/2015   Procedure: CESAREAN SECTION, BTL;  Surgeon: Gae Dry, MD;  Location: [facility] ORS;  Service: Obstetrics;  Laterality: N/A;  . NO PAST SURGERIES     Family History  Problem Relation Age of Onset  . Depression Mother   . Hypertension Mother   . Hypertension Father   . Alcohol abuse Father   . Heart disease Maternal Grandmother   . Hypertension Maternal Grandmother   . Hypertension Maternal Grandfather   . Cancer Maternal Grandfather    Social History   Socioeconomic History  . Marital status: Married    Spouse name: Not on file  . Number of children: Not on file  . Years of education: Not on file  . Highest education level: Not on file  Occupational History  . Not on file  Social Needs  . Financial resource strain: Not on file  . Food insecurity:    Worry: Not on file    Inability: Not on file  . Transportation needs:    Medical: Not on file    Non-medical: Not on file  Tobacco Use  . Smoking status: Never Smoker  .  Smokeless tobacco: Never Used  Substance and Sexual Activity  . Alcohol use: No  . Drug use: No  . Sexual activity: Yes  Lifestyle  . Physical activity:    Days per week: Not on file    Minutes per session: Not on file  . Stress: Not on file  Relationships  . Social connections:    Talks on phone: Not on file    Gets together: Not on file    Attends religious service: Not on file    Active member of club or organization: Not on file    Attends meetings of clubs or organizations: Not on file    Relationship status: Not on file  . Intimate partner violence:    Fear of current or ex partner: Not on file    Emotionally abused: Not on file    Physically abused: Not on file    Forced sexual activity: Not on file  Other Topics Concern  . 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    Current Meds  Medication Sig  . Cholecalciferol  50000 units capsule Take 1 capsule (50,000 Units total) by mouth once a week.  Marland Kitchen PROAIR HFA 108 (90 Base) MCG/ACT inhaler Inhale 1-2 puffs into the lungs every 6 (six) hours as needed for wheezing or shortness of breath.   No Known Allergies No results found for this or any previous visit (from the past 2160 hour(s)). Objective  Body mass index is 35.67 kg/m. Wt Readings from Last 3 Encounters:  02/11/18 207 lb 12.8 oz (94.3 kg)  08/12/17 200 lb (90.7 kg)  07/09/17 201 lb 6.4 oz (91.4 kg)   Temp Readings from Last 3 Encounters:  02/11/18 98.5 F (36.9 C) (Oral)  08/12/17 98.5 F (36.9 C) (Oral)  07/09/17 98.5 F (36.9 C) (Oral)   BP Readings from Last 3 Encounters:  02/11/18 122/72  08/12/17 110/78  07/09/17 120/78   Pulse Readings from Last 3 Encounters:  02/11/18 78  08/12/17 73  07/09/17 73    Physical Exam  Constitutional: She is oriented to person, place, and time.  Vital signs are normal. She appears well-developed and well-nourished. She is cooperative.  HENT:  Head: Normocephalic and atraumatic.  Mouth/Throat: Oropharynx is clear and moist and mucous membranes are normal.  Eyes: Pupils are equal, round, and reactive to light. Conjunctivae are normal.  Cardiovascular: Normal rate, regular rhythm and normal heart sounds.  Pulmonary/Chest: Effort normal and breath sounds normal. She exhibits no mass and no tenderness. Right breast exhibits no inverted nipple, no mass, no nipple discharge, no skin change and no tenderness. Left breast exhibits mass and tenderness. Left breast exhibits no inverted nipple, no nipple discharge and no skin change. No breast swelling, tenderness, discharge or bleeding. Breasts are symmetrical.    Neurological: She is alert and oriented to person, place, and time. Gait normal.  Skin: Skin is warm, dry and intact.  Psychiatric: She has a normal mood and affect. Her speech is normal and behavior is normal. Judgment and thought content normal. Cognition and memory are normal.  Nursing note and vitals reviewed.   Assessment   1. Annual  2. Facet deg changes right low back  3. Vit D def  4. Obesity BMI >35  Plan  1. Did not have flu shot prev declined  Tdap had 01/25/15  Consider hep B vaccine  MMR immune  Pap had Dr. Kenton Kingfisher 05/11/15 neg pap neg HPV [facility] 06/25/16 neg pap neg HPV  Referred mammogram dx left breast 2-3 oclock breast mass  Pap 06/25/16 negative neg HPV  rec healthy diet choices and exercise  2. Consider MRI low back if pain continues  3. rec D3 5000 IU qd after 6 months weekly 50K  4. adipex 15 mg qd x 2 months if tolerates continue another 2 months   Provider: Dr. Olivia Mackie McLean-Scocuzza-Internal Medicine

## 2018-02-11 NOTE — Patient Instructions (Addendum)
Premier protein shake  Or do own smoothies fruit, yogurt or kefir  Increase fiber intake make you feel full  (oatmeal steel cut)  Vitamin D take 5000 IU D3 when you run out of weekly D3   Phentermine tablets or capsules/Adipex let me know if you want to start this call back   What is this medicine? PHENTERMINE (FEN ter meen) decreases your appetite. It is used with a reduced calorie diet and exercise to help you lose weight. This medicine may be used for other purposes; ask your health care provider or pharmacist if you have questions. COMMON BRAND NAME(S): Adipex-P, Atti-Plex P, Atti-Plex P Spansule, Fastin, Lomaira, Pro-Fast, Tara-8 What should I tell my health care provider before I take this medicine? They need to know if you have any of these conditions: -agitation -glaucoma -heart disease -high blood pressure -history of substance abuse -lung disease called Primary Pulmonary Hypertension (PPH) -taken an MAOI like Carbex, Eldepryl, Marplan, Nardil, or Parnate in last 14 days -thyroid disease -an unusual or allergic reaction to phentermine, other medicines, foods, dyes, or preservatives -pregnant or trying to get pregnant -breast-feeding How should I use this medicine? Take this medicine by mouth with a glass of water. Follow the directions on the prescription label. The instructions for use may differ based on the product and dose you are taking. Avoid taking this medicine in the evening. It may interfere with sleep. Take your doses at regular intervals. Do not take your medicine more often than directed. Talk to your pediatrician regarding the use of this medicine in children. While this drug may be prescribed for children 17 years or older for selected conditions, precautions do apply. Overdosage: If you think you have taken too much of this medicine contact a poison control center or emergency room at once. NOTE: This medicine is only for you. Do not share this medicine with  others. What if I miss a dose? If you miss a dose, take it as soon as you can. If it is almost time for your next dose, take only that dose. Do not take double or extra doses. What may interact with this medicine? Do not take this medicine with any of the following medications: -duloxetine -MAOIs like Carbex, Eldepryl, Marplan, Nardil, and Parnate -medicines for colds or breathing difficulties like pseudoephedrine or phenylephrine -procarbazine -sibutramine -SSRIs like citalopram, escitalopram, fluoxetine, fluvoxamine, paroxetine, and sertraline -stimulants like dexmethylphenidate, methylphenidate or modafinil -venlafaxine This medicine may also interact with the following medications: -medicines for diabetes This list may not describe all possible interactions. Give your health care provider a list of all the medicines, herbs, non-prescription drugs, or dietary supplements you use. Also tell them if you smoke, drink alcohol, or use illegal drugs. Some items may interact with your medicine. What should I watch for while using this medicine? Notify your physician immediately if you become short of breath while doing your normal activities. Do not take this medicine within 6 hours of bedtime. It can keep you from getting to sleep. Avoid drinks that contain caffeine and try to stick to a regular bedtime every night. This medicine was intended to be used in addition to a healthy diet and exercise. The best results are achieved this way. This medicine is only indicated for short-term use. Eventually your weight loss may level out. At that point, the drug will only help you maintain your new weight. Do not increase or in any way change your dose without consulting your doctor. You may get drowsy  or dizzy. Do not drive, use machinery, or do anything that needs mental alertness until you know how this medicine affects you. Do not stand or sit up quickly, especially if you are an older patient. This  reduces the risk of dizzy or fainting spells. Alcohol may increase dizziness and drowsiness. Avoid alcoholic drinks. What side effects may I notice from receiving this medicine? Side effects that you should report to your doctor or health care professional as soon as possible: -chest pain, palpitations -depression or severe changes in mood -increased blood pressure -irritability -nervousness or restlessness -severe dizziness -shortness of breath -problems urinating -unusual swelling of the legs -vomiting Side effects that usually do not require medical attention (report to your doctor or health care professional if they continue or are bothersome): -blurred vision or other eye problems -changes in sexual ability or desire -constipation or diarrhea -difficulty sleeping -dry mouth or unpleasant taste -headache -nausea This list may not describe all possible side effects. Call your doctor for medical advice about side effects. You may report side effects to FDA at 1-800-FDA-1088. Where should I keep my medicine? Keep out of the reach of children. This medicine can be abused. Keep your medicine in a safe place to protect it from theft. Do not share this medicine with anyone. Selling or giving away this medicine is dangerous and against the law. This medicine may cause accidental overdose and death if taken by other adults, children, or pets. Mix any unused medicine with a substance like cat litter or coffee grounds. Then throw the medicine away in a sealed container like a sealed bag or a coffee can with a lid. Do not use the medicine after the expiration date. Store at room temperature between 20 and 25 degrees C (68 and 77 degrees F). Keep container tightly closed. NOTE: This sheet is a summary. It may not cover all possible information. If you have questions about this medicine, talk to your doctor, pharmacist, or health care provider.  2018 Elsevier/Gold Standard (2014-12-03 12:53:15)

## 2018-05-29 IMAGING — DX DG LUMBAR SPINE COMPLETE 4+V
5 series · 5 of 5 positions shown · non-contrast
Comparison: Chest radiographs 04/30/2013.

CLINICAL DATA: Low back pain with right-sided radicular symptoms
for 2 or 3 weeks.

EXAM:
LUMBAR SPINE - COMPLETE 4+ VIEW

[lumbar spine ap]
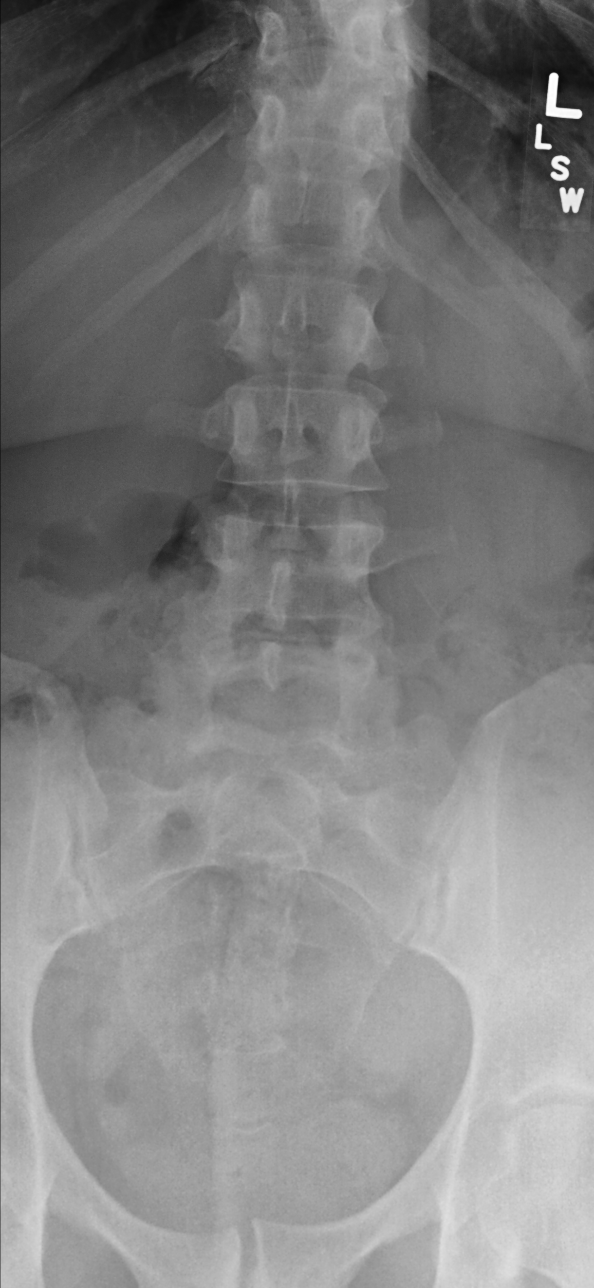

[lumbar spine obl (oblique) (1 of 2)]
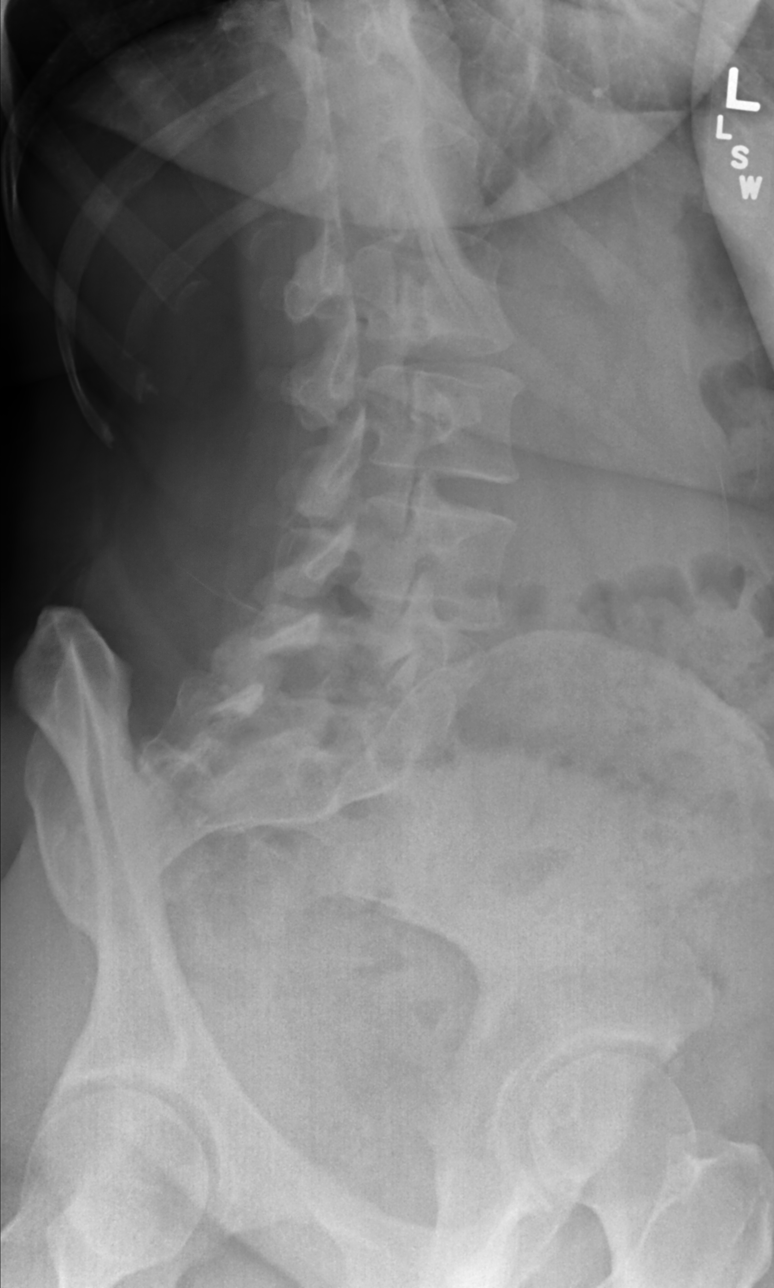

[lumbar spine obl (oblique) (2 of 2)]
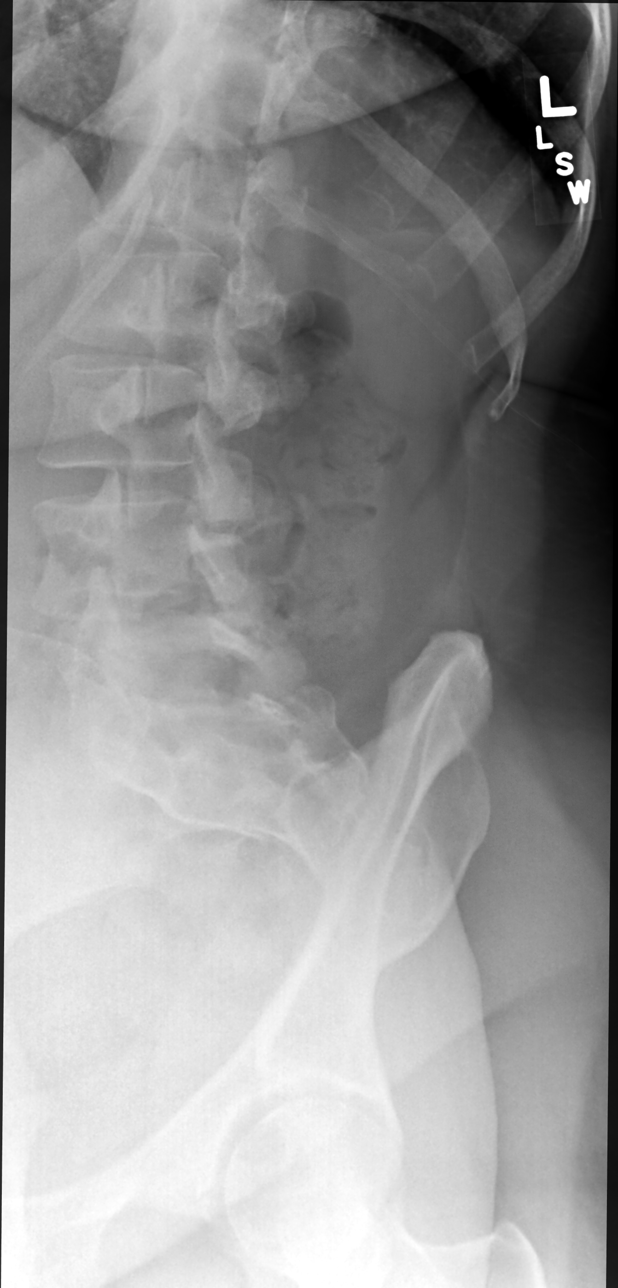

[lumbar spine lat]
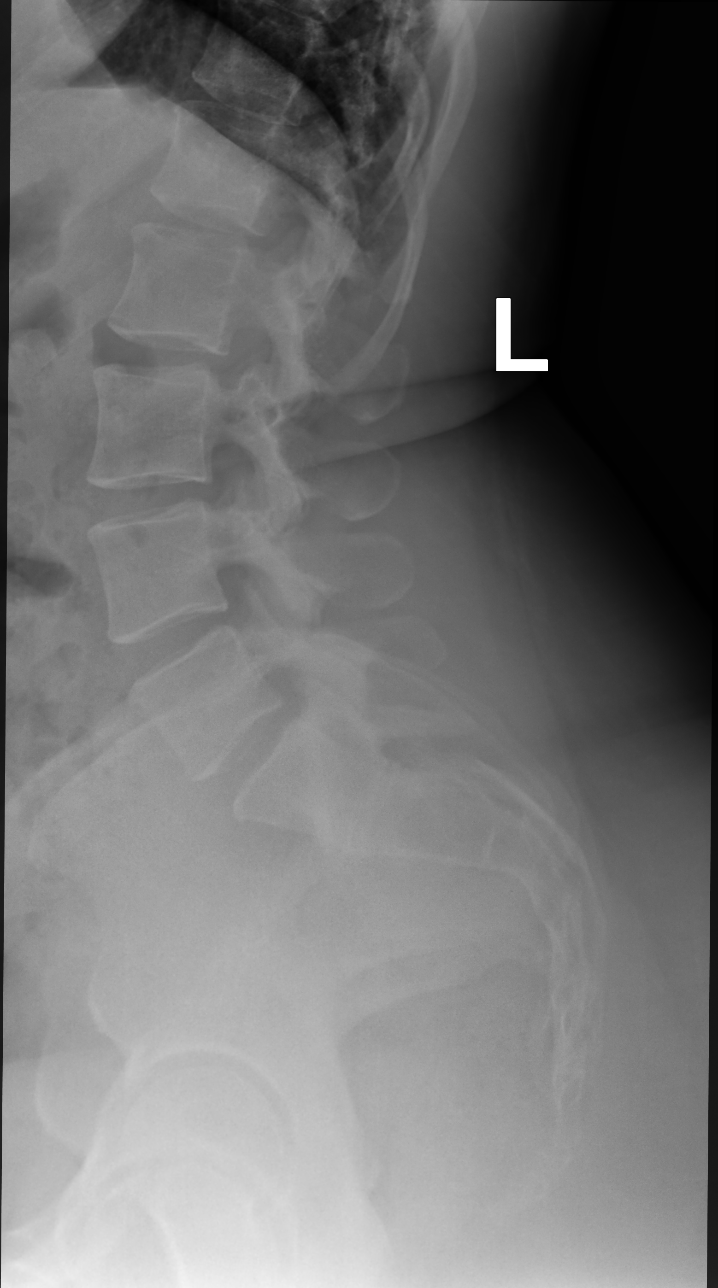

[lumbar spot lat]
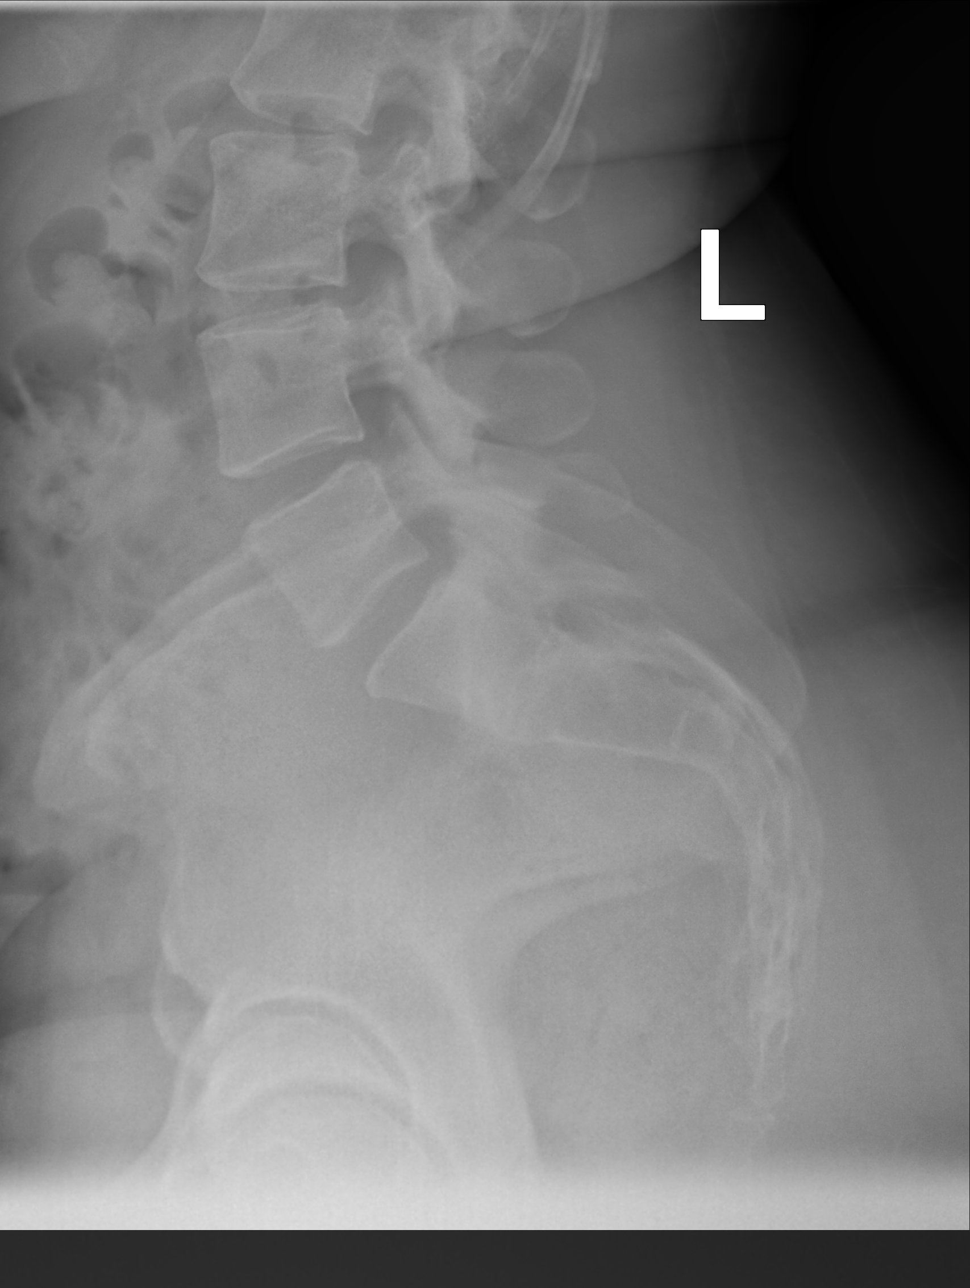

[5 of 5 positions shown; findings below may reference images not displayed]

FINDINGS: Transitional lumbosacral anatomy. Prior chest radiographs
demonstrate 12 rib-bearing thoracic type vertebral bodies. There are
4 true lumbar type vertebral bodies with a largely sacralized L5
segment. The disc spaces are preserved. There is no evidence of
acute fracture or pars defect. Facet degenerative changes are
asymmetric to the right inferiorly.
IMPRESSION: Transitional lumbosacral anatomy with facet hypertrophy inferiorly.
No acute osseous findings.

## 2018-08-25 ENCOUNTER — Telehealth: Payer: Self-pay | Admitting: Internal Medicine

## 2018-08-25 NOTE — Telephone Encounter (Signed)
Copied from [place] [id number]. Topic: Quick Communication - Rx Refill/Question >> Aug 25, 2018 12:50 PM Rainey Pines A wrote: Medication: PROAIR HFA 108 (90 Base) MCG/ACT inhaler  Has the patient contacted their pharmacy? Yes (Agent: If no, request that the patient contact the pharmacy for the refill.) (Agent: If yes, when and what did the pharmacy advise?)Contact PCP  Preferred Pharmacy (with phone number or street name): [place], [place] - [place] [phone] (Phone) [phone] (Fax)    Agent: Please be advised that RX refills may take up to 3 business days. We ask that you follow-up with your pharmacy.

## 2018-08-26 ENCOUNTER — Other Ambulatory Visit: Payer: Self-pay | Admitting: Internal Medicine

## 2018-08-26 DIAGNOSIS — J4520 Mild intermittent asthma, uncomplicated: Secondary | ICD-10-CM

## 2018-08-26 MED ORDER — PROAIR HFA 108 (90 BASE) MCG/ACT IN AERS: 1.0000 | INHALATION_SPRAY | Freq: Four times a day (QID) | RESPIRATORY_TRACT | 11 refills | Status: DC | PRN

## 2018-09-02 ENCOUNTER — Other Ambulatory Visit: Payer: Self-pay | Admitting: Internal Medicine

## 2018-09-02 DIAGNOSIS — J4520 Mild intermittent asthma, uncomplicated: Secondary | ICD-10-CM

## 2018-09-02 MED ORDER — PROAIR HFA 108 (90 BASE) MCG/ACT IN AERS: 1.0000 | INHALATION_SPRAY | Freq: Four times a day (QID) | RESPIRATORY_TRACT | 4 refills | Status: DC | PRN

## 2018-12-23 ENCOUNTER — Other Ambulatory Visit: Payer: Self-pay | Admitting: Internal Medicine

## 2018-12-23 DIAGNOSIS — J4520 Mild intermittent asthma, uncomplicated: Secondary | ICD-10-CM

## 2018-12-23 MED ORDER — ALBUTEROL SULFATE HFA 108 (90 BASE) MCG/ACT IN AERS: 1.0000 | INHALATION_SPRAY | Freq: Four times a day (QID) | RESPIRATORY_TRACT | 3 refills | Status: DC | PRN

## 2019-02-16 ENCOUNTER — Other Ambulatory Visit: Payer: Self-pay | Admitting: Internal Medicine

## 2019-02-16 DIAGNOSIS — J4520 Mild intermittent asthma, uncomplicated: Secondary | ICD-10-CM

## 2019-02-16 MED ORDER — ALBUTEROL SULFATE HFA 108 (90 BASE) MCG/ACT IN AERS: 1.0000 | INHALATION_SPRAY | Freq: Four times a day (QID) | RESPIRATORY_TRACT | 3 refills | Status: DC | PRN

## 2019-02-17 ENCOUNTER — Encounter: Payer: 59 | Admitting: Internal Medicine

## 2019-03-10 ENCOUNTER — Ambulatory Visit (INDEPENDENT_AMBULATORY_CARE_PROVIDER_SITE_OTHER): Payer: 59 | Admitting: Internal Medicine

## 2019-03-10 ENCOUNTER — Other Ambulatory Visit: Payer: Self-pay

## 2019-03-10 VITALS — Ht 64.0 in | Wt 193.0 lb

## 2019-03-10 DIAGNOSIS — Z1329 Encounter for screening for other suspected endocrine disorder: Secondary | ICD-10-CM | POA: Diagnosis not present

## 2019-03-10 DIAGNOSIS — Z0000 Encounter for general adult medical examination without abnormal findings: Secondary | ICD-10-CM | POA: Diagnosis not present

## 2019-03-10 DIAGNOSIS — Z1389 Encounter for screening for other disorder: Secondary | ICD-10-CM

## 2019-03-10 DIAGNOSIS — J4520 Mild intermittent asthma, uncomplicated: Secondary | ICD-10-CM

## 2019-03-10 DIAGNOSIS — Z13220 Encounter for screening for lipoid disorders: Secondary | ICD-10-CM | POA: Diagnosis not present

## 2019-03-10 DIAGNOSIS — E559 Vitamin D deficiency, unspecified: Secondary | ICD-10-CM

## 2019-03-10 DIAGNOSIS — N644 Mastodynia: Secondary | ICD-10-CM

## 2019-03-10 DIAGNOSIS — N943 Premenstrual tension syndrome: Secondary | ICD-10-CM

## 2019-03-10 DIAGNOSIS — Z124 Encounter for screening for malignant neoplasm of cervix: Secondary | ICD-10-CM

## 2019-03-10 DIAGNOSIS — R102 Pelvic and perineal pain: Secondary | ICD-10-CM

## 2019-03-10 DIAGNOSIS — Z1211 Encounter for screening for malignant neoplasm of colon: Secondary | ICD-10-CM

## 2019-03-10 DIAGNOSIS — R1032 Left lower quadrant pain: Secondary | ICD-10-CM

## 2019-03-10 NOTE — Patient Instructions (Addendum)
Call to schedule your mammogram Marilyn Hamilton or [facility] before 08/2019    Exercising to Lose Weight Exercise is structured, repetitive physical activity to improve fitness and health. Getting regular exercise is important for everyone. It is especially important if you are overweight. Being overweight increases your risk of heart disease, stroke, diabetes, high blood pressure, and several types of cancer. Reducing your calorie intake and exercising can help you lose weight. Exercise is usually categorized as moderate or vigorous intensity. To lose weight, most people need to do a certain amount of moderate-intensity or vigorous-intensity exercise each week. Moderate-intensity exercise  Moderate-intensity exercise is any activity that gets you moving enough to burn at least three times more energy (calories) than if you were sitting. Examples of moderate exercise include:  Walking a mile in 15 minutes.  Doing light yard work.  Biking at an easy pace. Most people should get at least 150 minutes (2 hours and 30 minutes) a week of moderate-intensity exercise to maintain their body weight. Vigorous-intensity exercise Vigorous-intensity exercise is any activity that gets you moving enough to burn at least six times more calories than if you were sitting. When you exercise at this intensity, you should be working hard enough that you are not able to carry on a conversation. Examples of vigorous exercise include:  Running.  Playing a team sport, such as football, basketball, and soccer.  Jumping rope. Most people should get at least 75 minutes (1 hour and 15 minutes) a week of vigorous-intensity exercise to maintain their body weight. How can exercise affect me? When you exercise enough to burn more calories than you eat, you lose weight. Exercise also reduces body fat and builds muscle. The more muscle you have, the more calories you burn. Exercise also:  Improves mood.  Reduces stress and  tension.  Improves your overall fitness, flexibility, and endurance.  Increases bone strength. The amount of exercise you need to lose weight depends on:  Your age.  The type of exercise.  Any health conditions you have.  Your overall physical ability. Talk to your health care provider about how much exercise you need and what types of activities are safe for you. What actions can I take to lose weight? Nutrition   Make changes to your diet as told by your health care provider or diet and nutrition specialist (dietitian). This may include: ? Eating fewer calories. ? Eating more protein. ? Eating less unhealthy fats. ? Eating a diet that includes fresh fruits and vegetables, whole grains, low-fat dairy products, and lean protein. ? Avoiding foods with added fat, salt, and sugar.  Drink plenty of water while you exercise to prevent dehydration or heat stroke. Activity  Choose an activity that you enjoy and set realistic goals. Your health care provider can help you make an exercise plan that works for you.  Exercise at a moderate or vigorous intensity most days of the week. ? The intensity of exercise may vary from person to person. You can tell how intense a workout is for you by paying attention to your breathing and heartbeat. Most people will notice their breathing and heartbeat get faster with more intense exercise.  Do resistance training twice each week, such as: ? Push-ups. ? Sit-ups. ? Lifting weights. ? Using resistance bands.  Getting short amounts of exercise can be just as helpful as long structured periods of exercise. If you have trouble finding time to exercise, try to include exercise in your daily routine. ? Get up, stretch,  and walk around every 30 minutes throughout the day. ? Go for a walk during your lunch break. ? Park your car farther away from your destination. ? If you take public transportation, get off one stop early and walk the rest of the  way. ? Make phone calls while standing up and walking around. ? Take the stairs instead of elevators or escalators.  Wear comfortable clothes and shoes with good support.  Do not exercise so much that you hurt yourself, feel dizzy, or get very short of breath. Where to find more information  U.S. Department of Health and Human Services: ThisPath.fiwww.hhs.gov  Centers for Disease Control and Prevention (CDC): FootballExhibition.com.brwww.cdc.gov Contact a health care provider:  Before starting a new exercise program.  If you have questions or concerns about your weight.  If you have a medical problem that keeps you from exercising. Get help right away if you have any of the following while exercising:  Injury.  Dizziness.  Difficulty breathing or shortness of breath that does not go away when you stop exercising.  Chest pain.  Rapid heartbeat. Summary  Being overweight increases your risk of heart disease, stroke, diabetes, high blood pressure, and several types of cancer.  Losing weight happens when you burn more calories than you eat.  Reducing the amount of calories you eat in addition to getting regular moderate or vigorous exercise each week helps you lose weight. This information is not intended to replace advice given to you by your health care provider. Make sure you discuss any questions you have with your health care provider. Document Released: 03/31/2010 Document Revised: 03/11/2017 Document Reviewed: 03/11/2017 Elsevier Patient Education  2020 Elsevier Inc.  Back Exercises The following exercises strengthen the muscles that help to support the trunk and back. They also help to keep the lower back flexible. Doing these exercises can help to prevent back pain or lessen existing pain.  If you have back pain or discomfort, try doing these exercises 2-3 times each day or as told by your health care provider.  As your pain improves, do them once each day, but increase the number of times that you  repeat the steps for each exercise (do more repetitions).  To prevent the recurrence of back pain, continue to do these exercises once each day or as told by your health care provider. Do exercises exactly as told by your health care provider and adjust them as directed. It is normal to feel mild stretching, pulling, tightness, or discomfort as you do these exercises, but you should stop right away if you feel sudden pain or your pain gets worse. Exercises Single knee to chest Repeat these steps 3-5 times for each leg: 1. Lie on your back on a firm bed or the floor with your legs extended. 2. Bring one knee to your chest. Your other leg should stay extended and in contact with the floor. 3. Hold your knee in place by grabbing your knee or thigh with both hands and hold. 4. Pull on your knee until you feel a gentle stretch in your lower back or buttocks. 5. Hold the stretch for 10-30 seconds. 6. Slowly release and straighten your leg. Pelvic tilt Repeat these steps 5-10 times: 1. Lie on your back on a firm bed or the floor with your legs extended. 2. Bend your knees so they are pointing toward the ceiling and your feet are flat on the floor. 3. Tighten your lower abdominal muscles to press your lower back against  the floor. This motion will tilt your pelvis so your tailbone points up toward the ceiling instead of pointing to your feet or the floor. 4. With gentle tension and even breathing, hold this position for 5-10 seconds. Cat-cow Repeat these steps until your lower back becomes more flexible: 1. Get into a hands-and-knees position on a firm surface. Keep your hands under your shoulders, and keep your knees under your hips. You may place padding under your knees for comfort. 2. Let your head hang down toward your chest. Contract your abdominal muscles and point your tailbone toward the floor so your lower back becomes rounded like the back of a cat. 3. Hold this position for 5  seconds. 4. Slowly lift your head, let your abdominal muscles relax and point your tailbone up toward the ceiling so your back forms a sagging arch like the back of a cow. 5. Hold this position for 5 seconds.  Press-ups Repeat these steps 5-10 times: 1. Lie on your abdomen (face-down) on the floor. 2. Place your palms near your head, about shoulder-width apart. 3. Keeping your back as relaxed as possible and keeping your hips on the floor, slowly straighten your arms to raise the top half of your body and lift your shoulders. Do not use your back muscles to raise your upper torso. You may adjust the placement of your hands to make yourself more comfortable. 4. Hold this position for 5 seconds while you keep your back relaxed. 5. Slowly return to lying flat on the floor.  Bridges Repeat these steps 10 times: 1. Lie on your back on a firm surface. 2. Bend your knees so they are pointing toward the ceiling and your feet are flat on the floor. Your arms should be flat at your sides, next to your body. 3. Tighten your buttocks muscles and lift your buttocks off the floor until your waist is at almost the same height as your knees. You should feel the muscles working in your buttocks and the back of your thighs. If you do not feel these muscles, slide your feet 1-2 inches farther away from your buttocks. 4. Hold this position for 3-5 seconds. 5. Slowly lower your hips to the starting position, and allow your buttocks muscles to relax completely. If this exercise is too easy, try doing it with your arms crossed over your chest. Abdominal crunches Repeat these steps 5-10 times: 1. Lie on your back on a firm bed or the floor with your legs extended. 2. Bend your knees so they are pointing toward the ceiling and your feet are flat on the floor. 3. Cross your arms over your chest. 4. Tip your chin slightly toward your chest without bending your neck. 5. Tighten your abdominal muscles and slowly raise  your trunk (torso) high enough to lift your shoulder blades a tiny bit off the floor. Avoid raising your torso higher than that because it can put too much stress on your low back and does not help to strengthen your abdominal muscles. 6. Slowly return to your starting position. Back lifts Repeat these steps 5-10 times: 1. Lie on your abdomen (face-down) with your arms at your sides, and rest your forehead on the floor. 2. Tighten the muscles in your legs and your buttocks. 3. Slowly lift your chest off the floor while you keep your hips pressed to the floor. Keep the back of your head in line with the curve in your back. Your eyes should be looking at the floor.  4. Hold this position for 3-5 seconds. 5. Slowly return to your starting position. Contact a health care provider if:  Your back pain or discomfort gets much worse when you do an exercise.  Your worsening back pain or discomfort does not lessen within 2 hours after you exercise. If you have any of these problems, stop doing these exercises right away. Do not do them again unless your health care provider says that you can. Get help right away if:  You develop sudden, severe back pain. If this happens, stop doing the exercises right away. Do not do them again unless your health care provider says that you can. This information is not intended to replace advice given to you by your health care provider. Make sure you discuss any questions you have with your health care provider. Document Released: 04/05/2004 Document Revised: 07/03/2018 Document Reviewed: 11/28/2017 Elsevier Patient Education  2020 [employer].

## 2019-03-10 NOTE — Progress Notes (Signed)
Virtual Visit via Video Note  I connected with Marilyn Hamilton  on 03/10/19 at  2:00 PM EST by a video enabled telemedicine application and verified that I am speaking with the correct person using two identifiers.  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 The patient expressed understanding and agreed to proceed.   HPI: 1. Annual  Chronic intermittent low back pain better alt tylenol and aleve changed shoes at work due to standing which helped as well   Vitamin D def resumed D3 otc and energy increased   C/o breast soreness and pms sx's around cycle and pap due 06/2019 will have pt f/u Dr. Kenton Kingfisher also c/o left groin pain/pelvic pain more so with cycle initiation  Asthma controlled prn Albuterol no sob/wheezing  ROS: See pertinent positives and negatives per HPI. General: weight down HEENT: normal hearring  CV: no chest pain Lungs: no sob  GI: no ab pain  MSK: chronic back pain improved  GU: PMS, breast soreness, groin pain on left/pelvic pain  Neuro: no h/a  Psych: normal mood  Skin: no issues  Past Medical History:  Diagnosis Date  . AMA (advanced maternal age) multigravida 71+   . Chicken pox   . Diverticulitis   . Low back pain   . UTI (urinary tract infection)   . Vitamin D deficiency     Past Surgical History:  Procedure Laterality Date  . CESAREAN SECTION N/A 03/30/2015   Procedure: CESAREAN SECTION, BTL;  Surgeon: Gae Dry, MD;  Location: [facility] ORS;  Service: Obstetrics;  Laterality: N/A;  . NO PAST SURGERIES      Family History  Problem Relation Age of Onset  . Depression Mother   . Hypertension Mother   . Hypertension Father   . Alcohol abuse Father   . Heart disease Maternal Grandmother   . Hypertension Maternal Grandmother   . Hypertension  Maternal Grandfather   . Cancer Maternal Grandfather     SOCIAL HX: married with kids   Current Outpatient Medications:  .  albuterol (VENTOLIN HFA) 108 (90 Base) MCG/ACT inhaler, Inhale 1-2 puffs into the lungs every 6 (six) hours as needed for wheezing or shortness of breath., Disp: 54 g, Rfl: 3 .  Cholecalciferol 50000 units capsule, Take 1 capsule (50,000 Units total) by mouth once a week., Disp: 13 capsule, Rfl: 1 .  Phentermine HCl 15 MG TBDP, Take 1 tablet by mouth every morning. In am, Disp: 60 tablet, Rfl: 0 No current facility-administered medications for this visit.  Facility-Administered Medications Ordered in Other Visits:  .  lidocaine-EPINEPHrine 1.5 %-1:200000 injection, , , Anesthesia Intra-op, Gunnar Fusi, MD, 3 mL at 03/29/15 2035  EXAM:  VITALS per patient if applicable:  GENERAL: alert, oriented, appears well and in no acute distress  HEENT: atraumatic, conjunttiva clear, no obvious abnormalities on inspection of external nose and ears  NECK: normal movements of the head and neck  LUNGS: on inspection no signs of respiratory distress, breathing rate appears normal, no obvious gross SOB, gasping or wheezing  CV: no obvious cyanosis  MS: moves all visible extremities without noticeable abnormality  PSYCH/NEURO: pleasant and cooperative, no obvious depression or anxiety, speech and thought processing grossly intact  ASSESSMENT AND PLAN:  Discussed the following assessment and plan:  Annual physical exam -  Did not have flu shotprev  declined Tdaphad 01/25/15 Consider hep B vaccine  MMR immune  Colonoscopy referred [facility] Dr. Alice Reichert screening   Pap had Dr. Kenton Kingfisher 05/11/15 neg pap neg HPVand alliance 06/25/16 neg pap neg HPV -referred Dr. Kenton Kingfisher Referred mammogram dx left breast 2-3 oclock breast mass call to schedule [facility] or [facility] Pap 06/25/16 negative neg HPV  rec healthy diet choices and exercise   Vitamin D deficiency - Plan: Vitamin D  (25 hydroxy) otc D3 qd   Asthma controlled  Prn albuterol   -we discussed possible serious and likely etiologies, options for evaluation and workup, limitations of telemedicine visit vs in person visit, treatment, treatment risks and precautions. Pt prefers to treat via telemedicine empirically rather then risking or undertaking an in person visit at this moment. Patient agrees to seek prompt in person care if worsening, new symptoms arise, or if is not improving with treatment.   I discussed the assessment and treatment plan with the patient. The patient was provided an opportunity to ask questions and all were answered. The patient agreed with the plan and demonstrated an understanding of the instructions.   The patient was advised to call back or seek an in-person evaluation if the symptoms worsen or if the condition fails to improve as anticipated.  Time spent 20 minutes  Delorise Jackson, MD

## 2019-03-19 ENCOUNTER — Telehealth: Payer: Self-pay | Admitting: Obstetrics & Gynecology

## 2019-03-19 NOTE — Telephone Encounter (Signed)
[facility] referring for PMS sxs, breast soreness, left groin/pelvic. Called and left voicemail for patient to call back to be schedule

## 2019-03-23 ENCOUNTER — Other Ambulatory Visit (INDEPENDENT_AMBULATORY_CARE_PROVIDER_SITE_OTHER): Payer: 59

## 2019-03-23 ENCOUNTER — Other Ambulatory Visit: Payer: Self-pay

## 2019-03-23 DIAGNOSIS — Z0000 Encounter for general adult medical examination without abnormal findings: Secondary | ICD-10-CM

## 2019-03-23 DIAGNOSIS — Z1389 Encounter for screening for other disorder: Secondary | ICD-10-CM

## 2019-03-23 DIAGNOSIS — Z13220 Encounter for screening for lipoid disorders: Secondary | ICD-10-CM | POA: Diagnosis not present

## 2019-03-23 DIAGNOSIS — E559 Vitamin D deficiency, unspecified: Secondary | ICD-10-CM | POA: Diagnosis not present

## 2019-03-23 DIAGNOSIS — Z1329 Encounter for screening for other suspected endocrine disorder: Secondary | ICD-10-CM

## 2019-03-23 LAB — CBC WITH DIFFERENTIAL/PLATELET
Basophils Absolute: 0.1 10*3/uL (ref 0.0–0.1)
Basophils Relative: 1 % (ref 0.0–3.0)
Eosinophils Absolute: 0.1 10*3/uL (ref 0.0–0.7)
Eosinophils Relative: 1.9 % (ref 0.0–5.0)
HCT: 34.7 % — ABNORMAL LOW (ref 36.0–46.0)
Hemoglobin: 11.4 g/dL — ABNORMAL LOW (ref 12.0–15.0)
Lymphocytes Relative: 35.2 % (ref 12.0–46.0)
Lymphs Abs: 2.1 10*3/uL (ref 0.7–4.0)
MCHC: 33 g/dL (ref 30.0–36.0)
MCV: 92.6 fl (ref 78.0–100.0)
Monocytes Absolute: 0.4 10*3/uL (ref 0.1–1.0)
Monocytes Relative: 7 % (ref 3.0–12.0)
Neutro Abs: 3.3 10*3/uL (ref 1.4–7.7)
Neutrophils Relative %: 54.9 % (ref 43.0–77.0)
Platelets: 240 10*3/uL (ref 150.0–400.0)
RBC: 3.75 Mil/uL — ABNORMAL LOW (ref 3.87–5.11)
RDW: 14.3 % (ref 11.5–15.5)
WBC: 6 10*3/uL (ref 4.0–10.5)

## 2019-03-23 LAB — COMPREHENSIVE METABOLIC PANEL
ALT: 6 U/L (ref 0–35)
AST: 11 U/L (ref 0–37)
Albumin: 4.1 g/dL (ref 3.5–5.2)
Alkaline Phosphatase: 62 U/L (ref 39–117)
BUN: 18 mg/dL (ref 6–23)
CO2: 27 mEq/L (ref 19–32)
Calcium: 9.3 mg/dL (ref 8.4–10.5)
Chloride: 105 mEq/L (ref 96–112)
Creatinine, Ser: 0.77 mg/dL (ref 0.40–1.20)
GFR: 97.67 mL/min (ref >60.00)
Glucose, Bld: 98 mg/dL (ref 70–99)
Potassium: 3.9 mEq/L (ref 3.5–5.1)
Sodium: 138 mEq/L (ref 135–145)
Total Bilirubin: 0.3 mg/dL (ref 0.2–1.2)
Total Protein: 7.3 g/dL (ref 6.0–8.3)

## 2019-03-23 LAB — LIPID PANEL
Cholesterol: 162 mg/dL (ref 0–200)
HDL: 49.2 mg/dL (ref >39.00)
LDL Cholesterol: 104 mg/dL — ABNORMAL HIGH (ref 0–99)
NonHDL: 113.12
Total CHOL/HDL Ratio: 3
Triglycerides: 47 mg/dL (ref 0.0–149.0)
VLDL: 9.4 mg/dL (ref 0.0–40.0)

## 2019-03-23 LAB — TSH: TSH: 1.14 u[IU]/mL (ref 0.35–4.50)

## 2019-03-23 LAB — VITAMIN D 25 HYDROXY (VIT D DEFICIENCY, FRACTURES): VITD: 43.31 ng/mL (ref 30.00–100.00)

## 2019-03-23 LAB — T4, FREE: Free T4: 0.78 ng/dL (ref 0.60–1.60)

## 2019-03-24 LAB — URINALYSIS, ROUTINE W REFLEX MICROSCOPIC
Bilirubin Urine: NEGATIVE
Glucose, UA: NEGATIVE
Hgb urine dipstick: NEGATIVE
Ketones, ur: NEGATIVE
Leukocytes,Ua: NEGATIVE
Nitrite: NEGATIVE
Protein, ur: NEGATIVE
Specific Gravity, Urine: 1.025 (ref 1.001–1.03)
pH: 5.5 (ref 5.0–8.0)

## 2019-03-26 NOTE — Telephone Encounter (Signed)
Called and left voice mail for patient to call back to be schedule °

## 2019-03-31 ENCOUNTER — Encounter: Payer: Self-pay | Admitting: Internal Medicine

## 2019-03-31 NOTE — Telephone Encounter (Signed)
Called and left voice mail for patient to call back to be schedule °

## 2019-07-13 ENCOUNTER — Telehealth: Payer: Self-pay | Admitting: Internal Medicine

## 2019-07-13 NOTE — Telephone Encounter (Signed)
Rejection Reason - Patient did not respond" [facility] said 43 minutes ago

## 2019-07-13 NOTE — Telephone Encounter (Signed)
Noted  

## 2019-09-09 ENCOUNTER — Ambulatory Visit: Payer: 59 | Admitting: Internal Medicine

## 2019-10-14 ENCOUNTER — Other Ambulatory Visit: Payer: Self-pay

## 2019-10-14 ENCOUNTER — Ambulatory Visit: Payer: 59 | Admitting: Internal Medicine

## 2019-10-14 ENCOUNTER — Encounter: Payer: Self-pay | Admitting: Internal Medicine

## 2019-10-14 VITALS — BP 128/80 | HR 72 | Temp 98.2°F | Ht 65.1 in | Wt 204.1 lb

## 2019-10-14 DIAGNOSIS — M25511 Pain in right shoulder: Secondary | ICD-10-CM | POA: Diagnosis not present

## 2019-10-14 DIAGNOSIS — H6123 Impacted cerumen, bilateral: Secondary | ICD-10-CM

## 2019-10-14 DIAGNOSIS — E669 Obesity, unspecified: Secondary | ICD-10-CM

## 2019-10-14 DIAGNOSIS — I83813 Varicose veins of bilateral lower extremities with pain: Secondary | ICD-10-CM | POA: Diagnosis not present

## 2019-10-14 NOTE — Patient Instructions (Addendum)
Voltaren Gel 4x per day  Tylenol  Ashby Dawes wise organic tumeric ginger curcumin    Ice your heels and do stretches   Vascular surgery in [place] [id number]   Please get pap and mammogram scheduled    Right shoulder pain  Call back for Xray right shoulder when ready   Varicose Veins Varicose veins are veins that have become enlarged, bulged, and twisted. They most often appear in the legs. What are the causes? This condition is caused by damage to the valves in the vein. These valves help blood return to your heart. When they are damaged and they stop working properly, blood may flow backward and back up in the veins near the skin, causing the veins to get larger and appear twisted. The condition can result from any issue that causes blood to back up, like pregnancy, prolonged standing, or obesity. What increases the risk? This condition is more likely to develop in people who are:  On their feet a lot.  Pregnant.  Overweight. What are the signs or symptoms? Symptoms of this condition include:  Bulging, twisted, and bluish veins.  A feeling of heaviness. This may be worse at the end of the day.  Leg pain. This may be worse at the end of the day.  Swelling in the leg.  Changes in skin color over the veins. How is this diagnosed? This condition may be diagnosed based on your symptoms, a physical exam, and an ultrasound test. How is this treated? Treatment for this condition may involve:  Avoiding sitting or standing in one position for long periods of time.  Wearing compression stockings. These stockings help to prevent blood clots and reduce swelling in the legs.  Raising (elevating) the legs when resting.  Losing weight.  Exercising regularly. If you have persistent symptoms or want to improve the way your varicose veins look, you may choose to have a procedure to close the varicose veins off or to remove them. Treatments to close off the  veins include:  Sclerotherapy. In this treatment, a solution is injected into a vein to close it off.  Laser treatment. In this treatment, the vein is heated with a laser to close it off.  Radiofrequency vein ablation. In this treatment, an electrical current produced by radio waves is used to close off the vein. Treatments to remove the veins include:  Phlebectomy. In this treatment, the veins are removed through small incisions made over the veins.  Vein ligation and stripping. In this treatment, incisions are made over the veins. The veins are then removed after being tied (ligated) with stitches (sutures). Follow these instructions at home: Activity  Walk as much as possible. Walking increases blood flow. This helps blood return to the heart and takes pressure off your veins. It also increases your cardiovascular strength.  Follow your health care provider's instructions about exercising.  Do not stand or sit in one position for a long period of time.  Do not sit with your legs crossed.  Rest with your legs raised during the day. General instructions   Follow any diet instructions given to you by your health care provider.  Wear compression stockings as directed by your health care provider. Do not wear other kinds of tight clothing around your legs, pelvis, or waist.  Elevate your legs at night to above the level of your heart.  If you get a cut in the skin over the varicose vein and the vein bleeds: ?  Lie down with your leg raised. ? Apply firm pressure to the cut with a clean cloth until the bleeding stops. ? Place a bandage (dressing) on the cut. Contact a health care provider if:  The skin around your varicose veins starts to break down.  You have pain, redness, tenderness, or hard swelling over a vein.  You are uncomfortable because of pain.  You get a cut in the skin over a varicose vein and it will not stop bleeding. Summary  Varicose veins are veins that  have become enlarged, bulged, and twisted. They most often appear in the legs.  This condition is caused by damage to the valves in the vein. These valves help blood return to your heart.  Treatment for this condition includes frequent movements, wearing compression stockings, losing weight, and exercising regularly. In some cases, procedures are done to close off or remove the veins.  Treatment for this condition may include wearing compression stockings, elevating the legs, losing weight, and engaging in regular activity. In some cases, procedures are done to close off or remove the veins. This information is not intended to replace advice given to you by your health care provider. Make sure you discuss any questions you have with your health care provider. Document Revised: 04/24/2018 Document Reviewed: 03/21/2016 [employer] Patient Education  2020 [employer]  Shoulder Range of Motion Exercises Shoulder range of motion (ROM) exercises are done to keep the shoulder moving freely or to increase movement. They are often recommended for people who have shoulder pain or stiffness or who are recovering from a shoulder surgery. Phase 1 exercises When you are able, do this exercise 1-2 times per day for 30-60 seconds in each direction, or as directed by your health care provider. Pendulum exercise To do this exercise while sitting: 1. Sit in a chair or at the edge of your bed with your feet flat on the floor. 2. Let your affected arm hang down in front of you over the edge of the bed or chair. 3. Relax your shoulder, arm, and hand. 4. Rock your body so your arm gently swings in small circles. You can also use your unaffected arm to start the motion. 5. Repeat changing the direction of the circles, swinging your arm left and right, and swinging your arm forward and back. To do this exercise while standing: 1. Stand next to a sturdy chair or table, and hold on to it with your hand on your  unaffected side. 2. Bend forward at the waist. 3. Bend your knees slightly. 4. Relax your shoulder, arm, and hand. 5. While keeping your shoulder relaxed, use body motion to swing your arm in small circles. 6. Repeat changing the direction of the circles, swinging your arm left and right, and swinging your arm forward and back. 7. Between exercises, stand up tall and take a short break to relax your lower back.  Phase 2 exercises Do these exercises 1-2 times per day or as told by your health care provider. Hold each stretch for 30 seconds, and repeat 3 times. Do the exercises with one or both arms as instructed by your health care provider. For these exercises, sit at a table with your hand and arm supported by the table. A chair that slides easily or has wheels can be helpful. External rotation 1. Turn your chair so that your affected side is nearest to the table. 2. Place your forearm on the table to your side. Bend your elbow about 90 at  the elbow (right angle) and place your hand palm facing down on the table. Your elbow should be about 6 inches away from your side. 3. Keeping your arm on the table, lean your body forward. Abduction 1. Turn your chair so that your affected side is nearest to the table. 2. Place your forearm and hand on the table so that your thumb points toward the ceiling and your arm is straight out to your side. 3. Slide your hand out to the side and away from you, using your unaffected arm to do the work. 4. To increase the stretch, you can slide your chair away from the table. Flexion: forward stretch 1. Sit facing the table. Place your hand and elbow on the table in front of you. 2. Slide your hand forward and away from you, using your unaffected arm to do the work. 3. To increase the stretch, you can slide your chair backward. Phase 3 exercises Do these exercises 1-2 times per day or as told by your health care provider. Hold each stretch for 30 seconds, and  repeat 3 times. Do the exercises with one or both arms as instructed by your health care provider. Cross-body stretch: posterior capsule stretch 1. Lift your arm straight out in front of you. 2. Bend your arm 90 at the elbow (right angle) so your forearm moves across your body. 3. Use your other arm to gently pull the elbow across your body, toward your other shoulder. Wall climbs 1. Stand with your affected arm extended out to the side with your hand resting on a door frame. 2. Slide your hand slowly up the door frame. 3. To increase the stretch, step through the door frame. Keep your body upright and do not lean. Wand exercises You will need a cane, a piece of PVC pipe, or a sturdy wooden dowel for wand exercises. Flexion To do this exercise while standing: 1. Hold the wand with both of your hands, palms down. 2. Using the other arm to help, lift your arms up and over your head, if able. 3. Push upward with your other arm to gently increase the stretch. To do this exercise while lying down: 1. Lie on your back with your elbows resting on the floor and the wand in both your hands. Your hands will be palm down, or pointing toward your feet. 2. Lift your hands toward the ceiling, using your unaffected arm to help if needed. 3. Bring your arms overhead as able, using your unaffected arm to help if needed. Internal rotation 1. Stand while holding the wand behind you with both hands. Your unaffected arm should be extended above your head with the arm of the affected side extended behind you at the level of your waist. The wand should be pointing straight up and down as you hold it. 2. Slowly pull the wand up behind your back by straightening the elbow of your unaffected arm and bending the elbow of your affected arm. External rotation 1. Lie on your back with your affected upper arm supported on a small pillow or rolled towel. When you first do this exercise, keep your upper arm close to your  body. Over time, bring your arm up to a 90 angle out to the side. 2. Hold the wand across your stomach and with both hands palm up. Your elbow on your affected side should be bent at a 90 angle. 3. Use your unaffected side to help push your forearm away from you and toward  the floor. Keep your elbow on your affected side bent at a 90 angle. Contact a health care provider if you have:  New or increasing pain.  New numbness, tingling, weakness, or discoloration in your arm or hand. This information is not intended to replace advice given to you by your health care provider. Make sure you discuss any questions you have with your health care provider. Document Revised: 04/10/2017 Document Reviewed: 04/10/2017 Else[employer] Patient Education  2020 [employer]  Shoulder Exercises Ask your health care provider which exercises are safe for you. Do exercises exactly as told by your health care provider and adjust them as directed. It is normal to feel mild stretching, pulling, tightness, or discomfort as you do these exercises. Stop right away if you feel sudden pain or your pain gets worse. Do not begin these exercises until told by your health care provider. Stretching exercises External rotation and abduction This exercise is sometimes called corner stretch. This exercise rotates your arm outward (external rotation) and moves your arm out from your body (abduction). 1. Stand in a doorway with one of your feet slightly in front of the other. This is called a staggered stance. If you cannot reach your forearms to the door frame, stand facing a corner of a room. 2. Choose one of the following positions as told by your health care provider: ? Place your hands and forearms on the door frame above your head. ? Place your hands and forearms on the door frame at the height of your head. ? Place your hands on the door frame at the height of your elbows. 3. Slowly move your weight onto your front foot until  you feel a stretch across your chest and in the front of your shoulders. Keep your head and chest upright and keep your abdominal muscles tight. 4. Hold for __________ seconds. 5. To release the stretch, shift your weight to your back foot. Repeat __________ times. Complete this exercise __________ times a day. Extension, standing 1. Stand and hold a broomstick, a cane, or a similar object behind your back. ? Your hands should be a little wider than shoulder width apart. ? Your palms should face away from your back. 2. Keeping your elbows straight and your shoulder muscles relaxed, move the stick away from your body until you feel a stretch in your shoulders (extension). ? Avoid shrugging your shoulders while you move the stick. Keep your shoulder blades tucked down toward the middle of your back. 3. Hold for __________ seconds. 4. Slowly return to the starting position. Repeat __________ times. Complete this exercise __________ times a day. Range-of-motion exercises Pendulum  1. Stand near a wall or a surface that you can hold onto for balance. 2. Bend at the waist and let your left / right arm hang straight down. Use your other arm to support you. Keep your back straight and do not lock your knees. 3. Relax your left / right arm and shoulder muscles, and move your hips and your trunk so your left / right arm swings freely. Your arm should swing because of the motion of your body, not because you are using your arm or shoulder muscles. 4. Keep moving your hips and trunk so your arm swings in the following directions, as told by your health care provider: ? Side to side. ? Forward and backward. ? In clockwise and counterclockwise circles. 5. Continue each motion for __________ seconds, or for as long as told by your health care provider.  6. Slowly return to the starting position. Repeat __________ times. Complete this exercise __________ times a day. Shoulder flexion, standing  1. Stand  and hold a broomstick, a cane, or a similar object. Place your hands a little more than shoulder width apart on the object. Your left / right hand should be palm up, and your other hand should be palm down. 2. Keep your elbow straight and your shoulder muscles relaxed. Push the stick up with your healthy arm to raise your left / right arm in front of your body, and then over your head until you feel a stretch in your shoulder (flexion). ? Avoid shrugging your shoulder while you raise your arm. Keep your shoulder blade tucked down toward the middle of your back. 3. Hold for __________ seconds. 4. Slowly return to the starting position. Repeat __________ times. Complete this exercise __________ times a day. Shoulder abduction, standing 1. Stand and hold a broomstick, a cane, or a similar object. Place your hands a little more than shoulder width apart on the object. Your left / right hand should be palm up, and your other hand should be palm down. 2. Keep your elbow straight and your shoulder muscles relaxed. Push the object across your body toward your left / right side. Raise your left / right arm to the side of your body (abduction) until you feel a stretch in your shoulder. ? Do not raise your arm above shoulder height unless your health care provider tells you to do that. ? If directed, raise your arm over your head. ? Avoid shrugging your shoulder while you raise your arm. Keep your shoulder blade tucked down toward the middle of your back. 3. Hold for __________ seconds. 4. Slowly return to the starting position. Repeat __________ times. Complete this exercise __________ times a day. Internal rotation  1. Place your left / right hand behind your back, palm up. 2. Use your other hand to dangle an exercise band, a towel, or a similar object over your shoulder. Grasp the band with your left / right hand so you are holding on to both ends. 3. Gently pull up on the band until you feel a stretch in  the front of your left / right shoulder. The movement of your arm toward the center of your body is called internal rotation. ? Avoid shrugging your shoulder while you raise your arm. Keep your shoulder blade tucked down toward the middle of your back. 4. Hold for __________ seconds. 5. Release the stretch by letting go of the band and lowering your hands. Repeat __________ times. Complete this exercise __________ times a day. Strengthening exercises External rotation  1. Sit in a stable chair without armrests. 2. Secure an exercise band to a stable object at elbow height on your left / right side. 3. Place a soft object, such as a folded towel or a small pillow, between your left / right upper arm and your body to move your elbow about 4 inches (10 cm) away from your side. 4. Hold the end of the exercise band so it is tight and there is no slack. 5. Keeping your elbow pressed against the soft object, slowly move your forearm out, away from your abdomen (external rotation). Keep your body steady so only your forearm moves. 6. Hold for __________ seconds. 7. Slowly return to the starting position. Repeat __________ times. Complete this exercise __________ times a day. Shoulder abduction  1. Sit in a stable chair without armrests, or stand  up. 2. Hold a __________ weight in your left / right hand, or hold an exercise band with both hands. 3. Start with your arms straight down and your left / right palm facing in, toward your body. 4. Slowly lift your left / right hand out to your side (abduction). Do not lift your hand above shoulder height unless your health care provider tells you that this is safe. ? Keep your arms straight. ? Avoid shrugging your shoulder while you do this movement. Keep your shoulder blade tucked down toward the middle of your back. 5. Hold for __________ seconds. 6. Slowly lower your arm, and return to the starting position. Repeat __________ times. Complete this  exercise __________ times a day. Shoulder extension 1. Sit in a stable chair without armrests, or stand up. 2. Secure an exercise band to a stable object in front of you so it is at shoulder height. 3. Hold one end of the exercise band in each hand. Your palms should face each other. 4. Straighten your elbows and lift your hands up to shoulder height. 5. Step back, away from the secured end of the exercise band, until the band is tight and there is no slack. 6. Squeeze your shoulder blades together as you pull your hands down to the sides of your thighs (extension). Stop when your hands are straight down by your sides. Do not let your hands go behind your body. 7. Hold for __________ seconds. 8. Slowly return to the starting position. Repeat __________ times. Complete this exercise __________ times a day. Shoulder row 1. Sit in a stable chair without armrests, or stand up. 2. Secure an exercise band to a stable object in front of you so it is at waist height. 3. Hold one end of the exercise band in each hand. Position your palms so that your thumbs are facing the ceiling (neutral position). 4. Bend each of your elbows to a 90-degree angle (right angle) and keep your upper arms at your sides. 5. Step back until the band is tight and there is no slack. 6. Slowly pull your elbows back behind you. 7. Hold for __________ seconds. 8. Slowly return to the starting position. Repeat __________ times. Complete this exercise __________ times a day. Shoulder press-ups  1. Sit in a stable chair that has armrests. Sit upright, with your feet flat on the floor. 2. Put your hands on the armrests so your elbows are bent and your fingers are pointing forward. Your hands should be about even with the sides of your body. 3. Push down on the armrests and use your arms to lift yourself off the chair. Straighten your elbows and lift yourself up as much as you comfortably can. ? Move your shoulder blades down, and  avoid letting your shoulders move up toward your ears. ? Keep your feet on the ground. As you get stronger, your feet should support less of your body weight as you lift yourself up. 4. Hold for __________ seconds. 5. Slowly lower yourself back into the chair. Repeat __________ times. Complete this exercise __________ times a day. Wall push-ups  1. Stand so you are facing a stable wall. Your feet should be about one arm-length away from the wall. 2. Lean forward and place your palms on the wall at shoulder height. 3. Keep your feet flat on the floor as you bend your elbows and lean forward toward the wall. 4. Hold for __________ seconds. 5. Straighten your elbows to push yourself back to  the starting position. Repeat __________ times. Complete this exercise __________ times a day. This information is not intended to replace advice given to you by your health care provider. Make sure you discuss any questions you have with your health care provider. Document Revised: 06/20/2018 Document Reviewed: 03/28/2018 [employer] Patient Education  2020 [employer]    Plantar Fasciitis  Plantar fasciitis is a painful foot condition that affects the heel. It occurs when the band of tissue that connects the toes to the heel bone (plantar fascia) becomes irritated. This can happen as the result of exercising too much or doing other repetitive activities (overuse injury). The pain from plantar fasciitis can range from mild irritation to severe pain that makes it difficult to walk or move. The pain is usually worse in the morning after sleeping, or after sitting or lying down for a while. Pain may also be worse after long periods of walking or standing. What are the causes? This condition may be caused by:  Standing for long periods of time.  Wearing shoes that do not have good arch support.  Doing activities that put stress on joints (high-impact activities), including running, aerobics, and  ballet.  Being overweight.  An abnormal way of walking (gait).  Tight muscles in the back of your lower leg (calf).  High arches in your feet.  Starting a new athletic activity. What are the signs or symptoms? The main symptom of this condition is heel pain. Pain may:  Be worse with first steps after a time of rest, especially in the morning after sleeping or after you have been sitting or lying down for a while.  Be worse after long periods of standing still.  Decrease after 30-45 minutes of activity, such as gentle walking. How is this diagnosed? This condition may be diagnosed based on your medical history and your symptoms. Your health care provider may ask questions about your activity level. Your health care provider will do a physical exam to check for:  A tender area on the bottom of your foot.  A high arch in your foot.  Pain when you move your foot.  Difficulty moving your foot. You may have imaging tests to confirm the diagnosis, such as:  X-rays.  Ultrasound.  MRI. How is this treated? Treatment for plantar fasciitis depends on how severe your condition is. Treatment may include:  Rest, ice, applying pressure (compression), and raising the affected foot (elevation). This may be called RICE therapy. Your health care provider may recommend RICE therapy along with over-the-counter pain medicines to manage your pain.  Exercises to stretch your calves and your plantar fascia.  A splint that holds your foot in a stretched, upward position while you sleep (night splint).  Physical therapy to relieve symptoms and prevent problems in the future.  Injections of steroid medicine (cortisone) to relieve pain and inflammation.  Stimulating your plantar fascia with electrical impulses (extracorporeal shock wave therapy). This is usually the last treatment option before surgery.  Surgery, if other treatments have not worked after 12 months. Follow these instructions at  home:  Managing pain, stiffness, and swelling  If directed, put ice on the painful area: ? Put ice in a plastic bag, or use a frozen bottle of water. ? Place a towel between your skin and the bag or bottle. ? Roll the bottom of your foot over the bag or bottle. ? Do this for 20 minutes, 2-3 times a day.  Wear athletic shoes that have air-sole or  gel-sole cushions, or try wearing soft shoe inserts that are designed for plantar fasciitis.  Raise (elevate) your foot above the level of your heart while you are sitting or lying down. Activity  Avoid activities that cause pain. Ask your health care provider what activities are safe for you.  Do physical therapy exercises and stretches as told by your health care provider.  Try activities and forms of exercise that are easier on your joints (low-impact). Examples include swimming, water aerobics, and biking. General instructions  Take over-the-counter and prescription medicines only as told by your health care provider.  Wear a night splint while sleeping, if told by your health care provider. Loosen the splint if your toes tingle, become numb, or turn cold and blue.  Maintain a healthy weight, or work with your health care provider to lose weight as needed.  Keep all follow-up visits as told by your health care provider. This is important. Contact a health care provider if you:  Have symptoms that do not go away after caring for yourself at home.  Have pain that gets worse.  Have pain that affects your ability to move or do your daily activities. Summary  Plantar fasciitis is a painful foot condition that affects the heel. It occurs when the band of tissue that connects the toes to the heel bone (plantar fascia) becomes irritated.  The main symptom of this condition is heel pain that may be worse after exercising too much or standing still for a long time.  Treatment varies, but it usually starts with rest, ice, compression, and  elevation (RICE therapy) and over-the-counter medicines to manage pain. This information is not intended to replace advice given to you by your health care provider. Make sure you discuss any questions you have with your health care provider. Document Revised: 02/08/2017 Document Reviewed: 12/24/2016 Elsevier Patient Education  2020 Elsevier Inc.  Plantar Fasciitis Rehab Ask your health care provider which exercises are safe for you. Do exercises exactly as told by your health care provider and adjust them as directed. It is normal to feel mild stretching, pulling, tightness, or discomfort as you do these exercises. Stop right away if you feel sudden pain or your pain gets worse. Do not begin these exercises until told by your health care provider. Stretching and range-of-motion exercises These exercises warm up your muscles and joints and improve the movement and flexibility of your foot. These exercises also help to relieve pain. Plantar fascia stretch  1. Sit with your left / right leg crossed over your opposite knee. 2. Hold your heel with one hand with that thumb near your arch. With your other hand, hold your toes and gently pull them back toward the top of your foot. You should feel a stretch on the bottom of your toes or your foot (plantar fascia) or both. 3. Hold this stretch for__________ seconds. 4. Slowly release your toes and return to the starting position. Repeat __________ times. Complete this exercise __________ times a day. Gastrocnemius stretch, standing This exercise is also called a calf (gastroc) stretch. It stretches the muscles in the back of the upper calf. 1. Stand with your hands against a wall. 2. Extend your left / right leg behind you, and bend your front knee slightly. 3. Keeping your heels on the floor and your back knee straight, shift your weight toward the wall. Do not arch your back. You should feel a gentle stretch in your upper left / right calf. 4. Hold  this position for __________ seconds. Repeat __________ times. Complete this exercise __________ times a day. Soleus stretch, standing This exercise is also called a calf (soleus) stretch. It stretches the muscles in the back of the lower calf. 1. Stand with your hands against a wall. 2. Extend your left / right leg behind you, and bend your front knee slightly. 3. Keeping your heels on the floor, bend your back knee and shift your weight slightly over your back leg. You should feel a gentle stretch deep in your lower calf. 4. Hold this position for __________ seconds. Repeat __________ times. Complete this exercise __________ times a day. Gastroc and soleus stretch, standing step This exercise stretches the muscles in the back of the lower leg. These muscles are in the upper calf (gastrocnemius) and the lower calf (soleus). 1. Stand with the ball of your left / right foot on a step. The ball of your foot is on the walking surface, right under your toes. 2. Keep your other foot firmly on the same step. 3. Hold on to the wall or a railing for balance. 4. Slowly lift your other foot, allowing your body weight to press your left / right heel down over the edge of the step. You should feel a stretch in your left / right calf. 5. Hold this position for __________ seconds. 6. Return both feet to the step. 7. Repeat this exercise with a slight bend in your left / right knee. Repeat __________ times with your left / right knee straight and __________ times with your left / right knee bent. Complete this exercise __________ times a day. Balance exercise This exercise builds your balance and strength control of your arch to help take pressure off your plantar fascia. Single leg stand If this exercise is too easy, you can try it with your eyes closed or while standing on a pillow. 1. Without shoes, stand near a railing or in a doorway. You may hold on to the railing or door frame as needed. 2. Stand on  your left / right foot. Keep your big toe down on the floor and try to keep your arch lifted. Do not let your foot roll inward. 3. Hold this position for __________ seconds. Repeat __________ times. Complete this exercise __________ times a day. This information is not intended to replace advice given to you by your health care provider. Make sure you discuss any questions you have with your health care provider. Document Revised: 06/19/2018 Document Reviewed: 12/25/2017 Elsevier Patient Education  2020 [employer].

## 2019-10-14 NOTE — Progress Notes (Signed)
Chief Complaint  Patient presents with  . Follow-up   F/u  1. Right should pain worse at night with lying down on this side and after work tried advil/Aleve right >left shoulder pain. 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 B/l ear wax for years issues and has to get cleaned out agreeable today  3. Spider veins to b/l legs with pain not currently wearing compression stockings but wants to see vascular to help  Review of Systems  Constitutional: Negative for weight loss.  HENT: Negative for hearing loss.   Eyes: Negative for blurred vision.  Respiratory: Negative for shortness of breath.   Cardiovascular: Negative for chest pain.  Gastrointestinal: Negative for abdominal pain.  Musculoskeletal: Positive for joint pain.  Skin: Negative for rash.  Neurological: Negative for headaches.  Psychiatric/Behavioral: Negative for depression.   Past Medical History:  Diagnosis Date  . AMA (advanced maternal age) multigravida 87+   . Chicken pox   . Diverticulitis   . Low back pain   . UTI (urinary tract infection)   . Vitamin D deficiency    Past Surgical History:  Procedure Laterality Date  . CESAREAN SECTION N/A 03/30/2015   Procedure: CESAREAN SECTION, BTL;  Surgeon: Gae Dry, MD;  Location: [facility] ORS;  Service: Obstetrics;  Laterality: N/A;  . NO PAST SURGERIES     Family History  Problem Relation Age of Onset  . Depression Mother   . Hypertension Mother   . Hypertension Father   . Alcohol abuse Father   . Heart disease Maternal Grandmother   . Hypertension Maternal Grandmother   . Hypertension Maternal Grandfather   . Cancer Maternal Grandfather    Social History   Socioeconomic History  . Marital status: Married    Spouse name: Not on file  . Number of children: Not on file  . Years of education: Not on file  . Highest education level: Not on file  Occupational History  . Not  on file  Tobacco Use  . Smoking status: Never Smoker  . Smokeless tobacco: Never Used  Substance and Sexual Activity  . Alcohol use: No  . Drug use: No  . Sexual activity: Yes  Other Topics Concern  . Not on file  Social History Narrative   Married    3 kids 53 y.o, 54 y.o and 2 y.o as of 06/2017    Never smoker    Wears selt belt, safe in relationship    No guns at home    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    Social Determinants of Health   Financial Resource Strain:   . Difficulty of Paying Living Expenses:   Food Insecurity:   . Worried About Charity fundraiser in the Last Year:   . Arboriculturist in the Last Year:   Transportation Needs:   . Film/video editor (Medical):   Marland Kitchen Lack of Transportation (Non-Medical):   Physical Activity:   . Days of Exercise per Week:   . Minutes of Exercise per Session:   Stress:   . Feeling of Stress :   Social Connections:   . Frequency of Communication with Friends and Family:   . Frequency of Social Gatherings with Friends and Family:   . Attends Religious Services:   . Active Member of Clubs or Organizations:   . Attends Archivist Meetings:   .  Marital Status:   Intimate Partner Violence:   . Fear of Current or Ex-Partner:   . Emotionally Abused:   Marland Kitchen Physically Abused:   . Sexually Abused:    Current Meds  Medication Sig  . albuterol (VENTOLIN HFA) 108 (90 Base) MCG/ACT inhaler Inhale 1-2 puffs into the lungs every 6 (six) hours as needed for wheezing or shortness of breath.  . Cholecalciferol 50000 units capsule Take 1 capsule (50,000 Units total) by mouth once a week.   No Known Allergies No results found for this or any previous visit (from the past 2160 hour(s)). Objective  Body mass index is 33.86 kg/m. Wt Readings from Last 3 Encounters:  10/14/19 204 lb 1.9 oz (92.6 kg)  03/10/19 193 lb (87.5 kg)  02/11/18 207 lb 12.8 oz (94.3 kg)   Temp Readings from Last 3 Encounters:  10/14/19 98.2 F (36.8 C)  (Oral)  02/11/18 98.5 F (36.9 C) (Oral)  08/12/17 98.5 F (36.9 C) (Oral)   BP Readings from Last 3 Encounters:  10/14/19 128/80  02/11/18 122/72  08/12/17 110/78   Pulse Readings from Last 3 Encounters:  10/14/19 72  02/11/18 78  08/12/17 73    Physical Exam Vitals and nursing note reviewed.  Constitutional:      Appearance: Normal appearance. She is well-developed and well-groomed. She is obese.  HENT:     Head: Normocephalic and atraumatic.     Right Ear: There is impacted cerumen.     Left Ear: There is impacted cerumen.  Eyes:     Conjunctiva/sclera: Conjunctivae normal.     Pupils: Pupils are equal, round, and reactive to light.  Cardiovascular:     Rate and Rhythm: Normal rate and regular rhythm.     Heart sounds: Normal heart sounds. No murmur heard.   Pulmonary:     Effort: Pulmonary effort is normal.     Breath sounds: Normal breath sounds.  Musculoskeletal:     Right shoulder: No tenderness. Normal range of motion.  Skin:    General: Skin is warm and dry.  Neurological:     General: No focal deficit present.     Mental Status: She is alert and oriented to person, place, and time. Mental status is at baseline.     Gait: Gait normal.  Psychiatric:        Attention and Perception: Attention and perception normal.        Mood and Affect: Mood and affect normal.        Speech: Speech normal.        Behavior: Behavior normal. Behavior is cooperative.        Thought Content: Thought content normal.        Cognition and Memory: Cognition and memory normal.        Judgment: Judgment normal.     Assessment  Plan  Acute pain of right shoulder - Plan: DG Shoulder Right declines today will do in future if needed  voltaren gel otc  Prn Tylenol  Exercises  Consider ortho if continues   Varicose veins of both lower extremities with pain Refer to vascular for tx options  Disc otc compression knee highs   Bilateral impacted cerumen Consented pt, she  tolerated b/l ear lavage today all wax removed b/l ears   Obesity (BMI 30-39.9)  rec healthy diet and exercise   HM Did not have flu shotprev declined covid 2/2  Tdaphad 01/25/15 Consider hep B vaccine  MMR immune Colonoscopy referred [facility] Dr.  [place] screening not had yet due to finannces as of 10/14/19  Pap had Dr. Kenton Kingfisher 05/11/15 neg pap neg HPVand [facility] 06/25/16 neg pap neg HPV -referred Dr. Kenton Kingfisher pt needs to call and scheduled   Referred mammogramdx left breast 2-3 oclock breast [facility]call to [facility] [place] [facility] 06/25/16 negative neg HPV -order for mammogram in pt needs to call and sch reports has not due to finances as of 10/14/19   rec healthy diet choices and exercise  Provider: Dr. Olivia Mackie McLean-Scocuzza-Internal Medicine

## 2019-10-14 NOTE — Progress Notes (Signed)
Patient presenting with nightly shoulder pain and occasional leg/foot pain. Patient has "spider veins" and would like to discuss possible referral and treatment. States that her jobs for years have her on her feet and also carried all three of her children to term working on her feet. No pain today.   Patient flagged: Current status:  PATIENT IS OVERDUE FOR BMI FOLLOW UP PLAN BMI is estimated to be 33.9 based on the last recorded weight and height

## 2019-10-15 DIAGNOSIS — M25511 Pain in right shoulder: Secondary | ICD-10-CM | POA: Insufficient documentation

## 2019-10-15 DIAGNOSIS — I83813 Varicose veins of bilateral lower extremities with pain: Secondary | ICD-10-CM | POA: Insufficient documentation

## 2019-10-15 DIAGNOSIS — H6123 Impacted cerumen, bilateral: Secondary | ICD-10-CM | POA: Insufficient documentation

## 2020-01-19 ENCOUNTER — Telehealth: Payer: Self-pay | Admitting: Internal Medicine

## 2020-01-19 NOTE — Telephone Encounter (Signed)
Rejection Reason - Patient Declined" [facility] said on Jan 19, 2020 2:09 PM

## 2020-08-16 ENCOUNTER — Telehealth: Payer: Self-pay | Admitting: Internal Medicine

## 2020-08-16 ENCOUNTER — Other Ambulatory Visit: Payer: Self-pay | Admitting: Internal Medicine

## 2020-08-16 DIAGNOSIS — J4520 Mild intermittent asthma, uncomplicated: Secondary | ICD-10-CM

## 2020-08-16 MED ORDER — ALBUTEROL SULFATE HFA 108 (90 BASE) MCG/ACT IN AERS: 1.0000 | INHALATION_SPRAY | Freq: Four times a day (QID) | RESPIRATORY_TRACT | 3 refills | Status: DC | PRN

## 2020-08-16 NOTE — Telephone Encounter (Signed)
Received request for Albuterol inhaler refill. Patient last seen 10/14/19.  Please advise

## 2020-09-09 NOTE — Telephone Encounter (Signed)
Medication sent in to preferred pharmacy per protocol.   

## 2020-09-09 NOTE — Telephone Encounter (Signed)
Okay per Dr French Ana McLean-Scocuzza

## 2021-03-02 ENCOUNTER — Encounter: Payer: Self-pay | Admitting: Internal Medicine

## 2021-03-02 ENCOUNTER — Other Ambulatory Visit: Payer: Self-pay

## 2021-03-02 ENCOUNTER — Ambulatory Visit (INDEPENDENT_AMBULATORY_CARE_PROVIDER_SITE_OTHER): Payer: 59 | Admitting: Internal Medicine

## 2021-03-02 ENCOUNTER — Other Ambulatory Visit (HOSPITAL_COMMUNITY)
Admission: RE | Admit: 2021-03-02 | Discharge: 2021-03-02 | Disposition: A | Discharge status: Home / Self Care | Payer: 59 | Source: Ambulatory Visit | Attending: Internal Medicine | Admitting: Internal Medicine

## 2021-03-02 VITALS — BP 124/86 | HR 66 | Temp 97.7°F | Ht 65.1 in | Wt 202.2 lb

## 2021-03-02 DIAGNOSIS — I83813 Varicose veins of bilateral lower extremities with pain: Secondary | ICD-10-CM

## 2021-03-02 DIAGNOSIS — Z1231 Encounter for screening mammogram for malignant neoplasm of breast: Secondary | ICD-10-CM | POA: Diagnosis not present

## 2021-03-02 DIAGNOSIS — J4520 Mild intermittent asthma, uncomplicated: Secondary | ICD-10-CM

## 2021-03-02 DIAGNOSIS — Z23 Encounter for immunization: Secondary | ICD-10-CM

## 2021-03-02 DIAGNOSIS — Z124 Encounter for screening for malignant neoplasm of cervix: Secondary | ICD-10-CM | POA: Insufficient documentation

## 2021-03-02 DIAGNOSIS — R200 Anesthesia of skin: Secondary | ICD-10-CM

## 2021-03-02 DIAGNOSIS — Z13818 Encounter for screening for other digestive system disorders: Secondary | ICD-10-CM

## 2021-03-02 DIAGNOSIS — Z0000 Encounter for general adult medical examination without abnormal findings: Secondary | ICD-10-CM | POA: Diagnosis not present

## 2021-03-02 DIAGNOSIS — R202 Paresthesia of skin: Secondary | ICD-10-CM

## 2021-03-02 DIAGNOSIS — J309 Allergic rhinitis, unspecified: Secondary | ICD-10-CM

## 2021-03-02 DIAGNOSIS — Z1211 Encounter for screening for malignant neoplasm of colon: Secondary | ICD-10-CM | POA: Diagnosis not present

## 2021-03-02 MED ORDER — PNEUMOCOCCAL 13-VAL CONJ VACC IM SUSP: 0.5000 mL | Freq: Once | INTRAMUSCULAR | 0 refills | Status: AC

## 2021-03-02 MED ORDER — LEVOCETIRIZINE DIHYDROCHLORIDE 5 MG PO TABS: 5.0000 mg | ORAL_TABLET | Freq: Every evening | ORAL | 3 refills | Status: DC | PRN

## 2021-03-02 MED ORDER — LEVOCETIRIZINE DIHYDROCHLORIDE 5 MG PO TABS: 5.0000 mg | ORAL_TABLET | Freq: Every evening | ORAL | 0 refills | Status: DC | PRN

## 2021-03-02 MED ORDER — ALBUTEROL SULFATE HFA 108 (90 BASE) MCG/ACT IN AERS: 1.0000 | INHALATION_SPRAY | Freq: Four times a day (QID) | RESPIRATORY_TRACT | 3 refills | Status: DC | PRN

## 2021-03-02 MED ORDER — LEVOCETIRIZINE DIHYDROCHLORIDE 5 MG PO TABS: 5.0000 mg | ORAL_TABLET | Freq: Every evening | ORAL | 3 refills | Status: DC

## 2021-03-02 NOTE — Progress Notes (Signed)
Chief Complaint  Patient presents with   Annual Exam   Annual  1. Asthma controlled on albuterol inhaler allergies wants to try otc allergy pill and had sinus issue weeks ago but resolved  2needs mammogram, colonoscopy  3. C/o leg numbness and tingling b/l and xray arthritis low back xray   Review of Systems  Constitutional:  Negative for weight loss.  HENT:  Negative for hearing loss.   Eyes:  Negative for blurred vision.  Respiratory:  Negative for shortness of breath.   Cardiovascular:  Negative for chest pain.  Gastrointestinal:  Negative for abdominal pain and blood in stool.  Genitourinary:  Negative for dysuria.  Musculoskeletal:  Negative for falls and joint pain.  Skin:  Negative for rash.  Neurological:  Negative for headaches.  Psychiatric/Behavioral:  Negative for depression.   Past Medical History:  Diagnosis Date   AMA (advanced maternal age) multigravida 35+    Chicken pox    Diverticulitis    Low back pain    UTI (urinary tract infection)    Vitamin D deficiency    Past Surgical History:  Procedure Laterality Date   CESAREAN SECTION N/A 03/30/2015   Procedure: CESAREAN SECTION, BTL;  Surgeon: Gae Dry, MD;  Location: [facility] ORS;  Service: Obstetrics;  Laterality: N/A;   NO PAST SURGERIES     Family History  Problem Relation Age of Onset   Depression Mother    Hypertension Mother    Hypertension Father    Alcohol abuse Father    Heart disease Maternal Grandmother    Hypertension Maternal Grandmother    Hypertension Maternal Grandfather    Cancer Maternal Grandfather    Social History   Socioeconomic History   Marital status: Married    Spouse name: Not on file   Number of children: Not on file   Years of education: Not on file   Highest education level: Not on file  Occupational History   Not on file  Tobacco Use   Smoking status: Never   Smokeless tobacco: Never  Substance and Sexual Activity   Alcohol use: No   Drug use: No   Sexual  activity: Yes  Other Topics Concern   Not on file  Social History Narrative   Married    3 kids 46 y.o, 58 y.o and 2 y.o as of 06/2017    Never smoker    Wears selt belt, safe in relationship    No guns at home    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    Social Determinants of Health   Financial Resource Strain: Not on file  Food Insecurity: Not on file  Transportation Needs: Not on file  Physical Activity: Not on file  Stress: Not on file  Social Connections: Not on file  Intimate Partner Violence: Not on file   Current Meds  Medication Sig   Cholecalciferol 50000 units capsule Take 1 capsule (50,000 Units total) by mouth once a week.   levocetirizine (XYZAL) 5 MG tablet Take 1 tablet (5 mg total) by mouth at bedtime as needed for allergies.   Phentermine HCl 15 MG TBDP Take 1 tablet by mouth every morning. In am   pneumococcal 13-valent conjugate vaccine (PREVNAR 13) SUSP injection Inject 0.5 mLs into the muscle once for 1 dose.   [DISCONTINUED] albuterol (VENTOLIN HFA) 108 (90 Base) MCG/ACT inhaler Inhale 1-2 puffs into the lungs every 6 (six) hours as needed for wheezing or shortness of breath.   [DISCONTINUED] levocetirizine (  XYZAL) 5 MG tablet Take 1 tablet (5 mg total) by mouth every evening.   No Known Allergies No results found for this or any previous visit (from the past 2160 hour(s)). Objective  Body mass index is 33.54 kg/m. Wt Readings from Last 3 Encounters:  03/02/21 202 lb 3.2 oz (91.7 kg)  10/14/19 204 lb 1.9 oz (92.6 kg)  03/10/19 193 lb (87.5 kg)   Temp Readings from Last 3 Encounters:  03/02/21 97.7 F (36.5 C) (Temporal)  10/14/19 98.2 F (36.8 C) (Oral)  02/11/18 98.5 F (36.9 C) (Oral)   BP Readings from Last 3 Encounters:  03/02/21 124/86  10/14/19 128/80  02/11/18 122/72   Pulse Readings from Last 3 Encounters:  03/02/21 66  10/14/19 72  02/11/18 78    Physical Exam Vitals and nursing note reviewed.  Constitutional:      Appearance:  Normal appearance. She is well-developed and well-groomed.  HENT:     Head: Normocephalic and atraumatic.  Eyes:     Conjunctiva/sclera: Conjunctivae normal.     Pupils: Pupils are equal, round, and reactive to light.  Cardiovascular:     Rate and Rhythm: Normal rate and regular rhythm.     Heart sounds: Normal heart sounds. No murmur heard. Pulmonary:     Effort: Pulmonary effort is normal.     Breath sounds: Normal breath sounds.  Chest:     Chest wall: No mass.  Breasts:    Breasts are symmetrical.     Right: Normal.     Left: Normal.  Abdominal:     General: Abdomen is flat. Bowel sounds are normal.     Tenderness: There is no abdominal tenderness.  Genitourinary:    General: Normal vulva.     Pubic Area: No rash.      Labia:        Right: No rash.        Left: No rash.      Vagina: Normal.     Cervix: Cervical bleeding present.     Uterus: Normal.      Adnexa: Right adnexa normal and left adnexa normal.     Comments: Lmp just stopped  Musculoskeletal:        General: No tenderness.  Lymphadenopathy:     Upper Body:     Right upper body: No axillary adenopathy.     Left upper body: No axillary adenopathy.  Skin:    General: Skin is warm and dry.  Neurological:     General: No focal deficit present.     Mental Status: She is alert and oriented to person, place, and time. Mental status is at baseline.     Cranial Nerves: Cranial nerves 2-12 are intact.     Gait: Gait is intact.  Psychiatric:        Attention and Perception: Attention and perception normal.        Mood and Affect: Mood and affect normal.        Speech: Speech normal.        Behavior: Behavior normal. Behavior is cooperative.        Thought Content: Thought content normal.        Cognition and Memory: Cognition and memory normal.        Judgment: Judgment normal.    Assessment  Plan  Annual physical exam - Plan: Comprehensive metabolic panel, Lipid panel, CBC with Differential/Platelet, TSH,  Urinalysis, Routine w reflex microscopic, Vitamin D (25 hydroxy), Vitamin B12, Hemoglobin A1c  Screening mammogram, encounter for - Plan: MM 3D SCREEN BREAST BILATERAL Encounter for screening colonoscopy - Plan: Ambulatory referral to Gastroenterology  Mild intermittent asthma, unspecified whether complicated - Plan: albuterol (VENTOLIN HFA) 108 (90 Base) MCG/ACT inhaler Add xyzall   Numbness and tingling of both legs Consider EMG/MRI low back    Allergic rhinitis, unspecified seasonality, unspecified trigger - Plan: levocetirizine (XYZAL) 5 MG tablet, levocetirizine (XYZAL) 5 MG tablet, DISCONTINUED: levocetirizine (XYZAL) 5 MG tablet  Varicose veins of both lower extremities with pain - Plan: Ambulatory referral to Vascular Surgery  hm Did not have flu shot prev declined  covid 3/3 rec 4th  Tdap had 01/25/15  Consider hep B vaccine  MMR immune  Colonoscopy referred [facility]  Pap today   Pap had Dr. Kenton Kingfisher 05/11/15 neg pap neg HPV and alliance 06/25/16 neg pap neg HPV  -referred Dr. Kenton Kingfisher pt needs to call and scheduled    Referred mammogram    rec healthy diet choices and exercise  Provider: Dr. Olivia Mackie McLean-Scocuzza-Internal Medicine

## 2021-03-02 NOTE — Patient Instructions (Addendum)
Let me know when ready for nerve conduction study emg lower legs and or MRI low back  Rec total 4 covid  shots pfizer  Think about prevnar 13 pneumonia vaccine and come here pharmacy    Pneumococcal Conjugate Vaccine (Prevnar 13) Suspension for Injection What is this medication? PNEUMOCOCCAL VACCINE (NEU mo KOK al vak SEEN) is a vaccine used to prevent pneumococcus bacterial infections. These bacteria can cause serious infections like pneumonia, meningitis, and blood infections. This vaccine will lower your chance of getting pneumonia. If you do get pneumonia, it can make your symptoms milder and your illness shorter. This vaccine will not treat an infection and will not cause infection. This vaccine is recommended for infants and young children, adults with certain medical conditions, and adults 65 years or older. This medicine may be used for other purposes; ask your health care provider or pharmacist if you have questions. COMMON BRAND NAME(S): Prevnar, Prevnar 13 What should I tell my care team before I take this medication? They need to know if you have any of these conditions: bleeding problems fever immune system problems an unusual or allergic reaction to pneumococcal vaccine, diphtheria toxoid, other vaccines, latex, other medicines, foods, dyes, or preservatives pregnant or trying to get pregnant breast-feeding How should I use this medication? This vaccine is for injection into a muscle. It is given by a health care professional. A copy of Vaccine Information Statements will be given before each vaccination. Read this sheet carefully each time. The sheet may change frequently. Talk to your pediatrician regarding the use of this medicine in children. While this drug may be prescribed for children as young as 65 weeks old for selected conditions, precautions do apply. Overdosage: If you think you have taken too much of this medicine contact a poison control center or emergency room at  once. NOTE: This medicine is only for you. Do not share this medicine with others. What if I miss a dose? It is important not to miss your dose. Call your doctor or health care professional if you are unable to keep an appointment. What may interact with this medication? medicines for cancer chemotherapy medicines that suppress your immune function steroid medicines like prednisone or cortisone This list may not describe all possible interactions. Give your health care provider a list of all the medicines, herbs, non-prescription drugs, or dietary supplements you use. Also tell them if you smoke, drink alcohol, or use illegal drugs. Some items may interact with your medicine. What should I watch for while using this medication? Mild fever and pain should go away in 3 days or less. Report any unusual symptoms to your doctor or health care professional. What side effects may I notice from receiving this medication? Side effects that you should report to your doctor or health care professional as soon as possible: allergic reactions like skin rash, itching or hives, swelling of the face, lips, or tongue breathing problems confused fast or irregular heartbeat fever over 102 degrees F seizures unusual bleeding or bruising unusual muscle weakness Side effects that usually do not require medical attention (report to your doctor or health care professional if they continue or are bothersome): aches and pains diarrhea fever of 102 degrees F or less headache irritable loss of appetite pain, tender at site where injected trouble sleeping This list may not describe all possible side effects. Call your doctor for medical advice about side effects. You may report side effects to FDA at 1-800-FDA-1088. Where should I keep my  medication? This does not apply. This vaccine is given in a clinic, pharmacy, doctor's office, or other health care setting and will not be stored at home. NOTE: This sheet is  a summary. It may not cover all possible information. If you have questions about this medicine, talk to your doctor, pharmacist, or health care provider.  2022 Elsevier/Gold Standard (2013-12-03 00:00:00)

## 2021-03-08 LAB — CYTOLOGY - PAP
Comment: NEGATIVE
Diagnosis: NEGATIVE
High risk HPV: NEGATIVE

## 2021-03-10 ENCOUNTER — Encounter: Payer: Self-pay | Admitting: *Deleted

## 2021-03-29 ENCOUNTER — Other Ambulatory Visit (INDEPENDENT_AMBULATORY_CARE_PROVIDER_SITE_OTHER): Payer: 59

## 2021-03-29 ENCOUNTER — Other Ambulatory Visit: Payer: Self-pay

## 2021-03-29 ENCOUNTER — Encounter: Payer: Self-pay | Admitting: *Deleted

## 2021-03-29 DIAGNOSIS — Z0000 Encounter for general adult medical examination without abnormal findings: Secondary | ICD-10-CM | POA: Diagnosis not present

## 2021-03-29 DIAGNOSIS — Z13818 Encounter for screening for other digestive system disorders: Secondary | ICD-10-CM | POA: Diagnosis not present

## 2021-03-29 LAB — LIPID PANEL
Cholesterol: 165 mg/dL (ref 0–200)
HDL: 50.4 mg/dL (ref >39.00)
LDL Cholesterol: 104 mg/dL — ABNORMAL HIGH (ref 0–99)
NonHDL: 115.08
Total CHOL/HDL Ratio: 3
Triglycerides: 53 mg/dL (ref 0.0–149.0)
VLDL: 10.6 mg/dL (ref 0.0–40.0)

## 2021-03-29 LAB — COMPREHENSIVE METABOLIC PANEL
ALT: 10 U/L (ref 0–35)
AST: 12 U/L (ref 0–37)
Albumin: 4.3 g/dL (ref 3.5–5.2)
Alkaline Phosphatase: 68 U/L (ref 39–117)
BUN: 17 mg/dL (ref 6–23)
CO2: 29 mEq/L (ref 19–32)
Calcium: 9.4 mg/dL (ref 8.4–10.5)
Chloride: 103 mEq/L (ref 96–112)
Creatinine, Ser: 0.8 mg/dL (ref 0.40–1.20)
GFR: 87.4 mL/min (ref >60.00)
Glucose, Bld: 87 mg/dL (ref 70–99)
Potassium: 4.4 mEq/L (ref 3.5–5.1)
Sodium: 138 mEq/L (ref 135–145)
Total Bilirubin: 0.3 mg/dL (ref 0.2–1.2)
Total Protein: 7.1 g/dL (ref 6.0–8.3)

## 2021-03-29 LAB — CBC WITH DIFFERENTIAL/PLATELET
Basophils Absolute: 0 10*3/uL (ref 0.0–0.1)
Basophils Relative: 0.9 % (ref 0.0–3.0)
Eosinophils Absolute: 0.1 10*3/uL (ref 0.0–0.7)
Eosinophils Relative: 3.1 % (ref 0.0–5.0)
HCT: 37 % (ref 36.0–46.0)
Hemoglobin: 12 g/dL (ref 12.0–15.0)
Lymphocytes Relative: 38.4 % (ref 12.0–46.0)
Lymphs Abs: 1.7 10*3/uL (ref 0.7–4.0)
MCHC: 32.5 g/dL (ref 30.0–36.0)
MCV: 93 fl (ref 78.0–100.0)
Monocytes Absolute: 0.3 10*3/uL (ref 0.1–1.0)
Monocytes Relative: 7.5 % (ref 3.0–12.0)
Neutro Abs: 2.3 10*3/uL (ref 1.4–7.7)
Neutrophils Relative %: 50.1 % (ref 43.0–77.0)
Platelets: 221 10*3/uL (ref 150.0–400.0)
RBC: 3.97 Mil/uL (ref 3.87–5.11)
RDW: 14.1 % (ref 11.5–15.5)
WBC: 4.5 10*3/uL (ref 4.0–10.5)

## 2021-03-29 LAB — VITAMIN D 25 HYDROXY (VIT D DEFICIENCY, FRACTURES): VITD: 35.47 ng/mL (ref 30.00–100.00)

## 2021-03-29 LAB — TSH: TSH: 1.47 u[IU]/mL (ref 0.35–5.50)

## 2021-03-29 LAB — HEMOGLOBIN A1C: Hgb A1c MFr Bld: 5.8 % (ref 4.6–6.5)

## 2021-03-29 LAB — VITAMIN B12: Vitamin B-12: 414 pg/mL (ref 211–911)

## 2021-03-29 NOTE — Addendum Note (Signed)
Addended by: Warden Fillers on: 03/29/2021 10:27 AM   Modules accepted: Orders

## 2021-03-30 LAB — HEPATITIS C ANTIBODY
Hepatitis C Ab: NONREACTIVE
SIGNAL TO CUT-OFF: 0.13 (ref <1.00)

## 2021-04-03 ENCOUNTER — Other Ambulatory Visit: Payer: 59

## 2021-04-03 ENCOUNTER — Other Ambulatory Visit: Payer: Self-pay

## 2021-04-03 DIAGNOSIS — Z0000 Encounter for general adult medical examination without abnormal findings: Secondary | ICD-10-CM

## 2021-04-04 ENCOUNTER — Other Ambulatory Visit: Payer: 59

## 2021-04-04 ENCOUNTER — Telehealth: Payer: Self-pay

## 2021-04-04 LAB — URINALYSIS, ROUTINE W REFLEX MICROSCOPIC
Bilirubin Urine: NEGATIVE
Glucose, UA: NEGATIVE
Hgb urine dipstick: NEGATIVE
Ketones, ur: NEGATIVE
Leukocytes,Ua: NEGATIVE
Nitrite: NEGATIVE
Protein, ur: NEGATIVE
Specific Gravity, Urine: 1.026 (ref 1.001–1.035)
pH: 6.5 (ref 5.0–8.0)

## 2021-04-04 NOTE — Telephone Encounter (Signed)
Patient called in stated she is returning a call to Shanda Bumps , I called Jessica ring no answer. Please call patient @ [phone]

## 2021-04-04 NOTE — Telephone Encounter (Signed)
LMTCB in regards to lab results.  

## 2021-04-04 NOTE — Telephone Encounter (Signed)
See result note message 

## 2021-04-11 ENCOUNTER — Encounter: Payer: Self-pay | Admitting: *Deleted

## 2022-03-06 ENCOUNTER — Encounter: Payer: 59 | Admitting: Internal Medicine

## 2022-03-13 ENCOUNTER — Telehealth: Payer: Self-pay | Admitting: Internal Medicine

## 2022-03-13 DIAGNOSIS — J4520 Mild intermittent asthma, uncomplicated: Secondary | ICD-10-CM

## 2022-03-13 MED ORDER — ALBUTEROL SULFATE HFA 108 (90 BASE) MCG/ACT IN AERS: 1.0000 | INHALATION_SPRAY | Freq: Four times a day (QID) | RESPIRATORY_TRACT | 3 refills | Status: DC | PRN

## 2022-03-13 NOTE — Telephone Encounter (Signed)
Pt called stating the cma called in the wrong inhaler. The inhaler is supposed to be the proair that was prescribed by [facility]. Pt is completely out of the medication and [facility] is stating the proair inhaler is discontinued but [facility] is stating it is not discontinued. Pt need to know what is going on before she buys the medication. Pt would like to be called

## 2022-03-13 NOTE — Telephone Encounter (Signed)
Refill has been sent.  °

## 2022-03-13 NOTE — Telephone Encounter (Signed)
Prescription Request  03/13/2022  Is this a "Controlled Substance" medicine? No  LOV: Visit date not found  What is the name of the medication or equipment? albuterol (VENTOLIN HFA) 108 (90 Base) MCG/ACT inhaler         Have you contacted your pharmacy to request a refill? Yes   Which pharmacy would you like this sent to?   [facility] - [place], [place] - [address] [address] Phone: [phone] Fax: [phone]      Patient notified that their request is being sent to the clinical staff for review and that they should receive a response within 2 business days.   Please advise at Mobile [phone] (mobile)

## 2022-03-13 NOTE — Telephone Encounter (Signed)
Left a detailed msg explaining that the refill was sent for the correct medicine as the albuterol is a generic of the Pro air pt has not been on Pro air since 2020 and if she would like to be prescribed Proair she would need to be seen by provider.

## 2022-04-09 NOTE — Progress Notes (Unsigned)
  Marilyn Morrow, NP-C Phone: [phone]  Marilyn Hamilton is a 47 y.o. female who presents today for transfer of care.   Asthma: Marilyn Hamilton presents for evaluation of asthma. Patient's symptoms include {asthma symptoms:406::"dyspnea","non-productive cough","wheezing"}. Associated symptoms include {respiratory symptoms:16811}. The patient has been suffering from these symptoms for approximately {numbers 1-12:10294} {day/week/month:11}. Symptoms have been {clinical course - history:17::"unchanged"} since their onset. Medications used in the past to treat these symptoms include {asthma treatment:11087}. Suspected precipitants include {asthma precips:408}. Patient is awoken from sleep approximately {0-5 (w/ >5):11666} times per night. Patient {has/not:18111} required Emergency Room treatment for these symptoms, and {has/not:18111} required hospitalization. The patient {has/not:18111} been intubated in the past.   Social History   Tobacco Use  Smoking Status Never  Smokeless Tobacco Never    Current Outpatient Medications on File Prior to Visit  Medication Sig Dispense Refill   albuterol (VENTOLIN HFA) 108 (90 Base) MCG/ACT inhaler Inhale 1-2 puffs into the lungs every 6 (six) hours as needed for wheezing or shortness of breath. 54 g 3   Cholecalciferol 50000 units capsule Take 1 capsule (50,000 Units total) by mouth once a week. 13 capsule 1   levocetirizine (XYZAL) 5 MG tablet Take 1 tablet (5 mg total) by mouth at bedtime as needed for allergies. 30 tablet 0   levocetirizine (XYZAL) 5 MG tablet Take 1 tablet (5 mg total) by mouth at bedtime as needed for allergies. 90 tablet 3   Phentermine HCl 15 MG TBDP Take 1 tablet by mouth every morning. In am 60 tablet 0   Current Facility-Administered Medications on File Prior to Visit  Medication Dose Route Frequency Provider Last Rate Last Admin   lidocaine-EPINEPHrine 1.5 %-1:200000 injection    Anesthesia Intra-op Gunnar Fusi, MD   3 mL  at 03/29/15 2035     ROS see history of present illness  Objective  Physical Exam There were no vitals filed for this visit.  BP Readings from Last 3 Encounters:  03/02/21 124/86  10/14/19 128/80  02/11/18 122/72   Wt Readings from Last 3 Encounters:  03/02/21 202 lb 3.2 oz (91.7 kg)  10/14/19 204 lb 1.9 oz (92.6 kg)  03/10/19 193 lb (87.5 kg)    Physical Exam   Assessment/Plan: Please see individual problem list.  There are no diagnoses linked to this encounter.   Health Maintenance: ***  No follow-ups on file.   Marilyn Morrow, NP-C [place]

## 2022-04-10 ENCOUNTER — Ambulatory Visit: Payer: 59 | Admitting: Nurse Practitioner

## 2022-04-10 ENCOUNTER — Encounter: Payer: Self-pay | Admitting: Nurse Practitioner

## 2022-04-10 ENCOUNTER — Ambulatory Visit (INDEPENDENT_AMBULATORY_CARE_PROVIDER_SITE_OTHER): Payer: 59

## 2022-04-10 VITALS — BP 136/80 | HR 78 | Temp 98.8°F | Ht 65.1 in | Wt 201.0 lb

## 2022-04-10 DIAGNOSIS — J4520 Mild intermittent asthma, uncomplicated: Secondary | ICD-10-CM

## 2022-04-10 DIAGNOSIS — Z1231 Encounter for screening mammogram for malignant neoplasm of breast: Secondary | ICD-10-CM | POA: Diagnosis not present

## 2022-04-10 DIAGNOSIS — Z1211 Encounter for screening for malignant neoplasm of colon: Secondary | ICD-10-CM

## 2022-04-10 DIAGNOSIS — R6882 Decreased libido: Secondary | ICD-10-CM | POA: Diagnosis not present

## 2022-04-10 DIAGNOSIS — M25511 Pain in right shoulder: Secondary | ICD-10-CM

## 2022-04-10 DIAGNOSIS — Z0000 Encounter for general adult medical examination without abnormal findings: Secondary | ICD-10-CM

## 2022-04-10 DIAGNOSIS — Z114 Encounter for screening for human immunodeficiency virus [HIV]: Secondary | ICD-10-CM

## 2022-04-10 NOTE — Assessment & Plan Note (Signed)
Lab work as outlined. Discussed with patient that it could be due to perimenopause. Will contact patient with results.

## 2022-04-10 NOTE — Assessment & Plan Note (Signed)
Asymptomatic today. Continue albuterol inhaler PRN. Patient aware of triggers. Will continue to monitor.

## 2022-04-10 NOTE — Assessment & Plan Note (Signed)
X-ray today. 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 Discussed with patient referral to Orthopedics or physical therapy if pain continues or is worsening. Can continue OTC Aspercreme, PRN Tylenol/Ibuprofen and heating pad. Will contact patient with results of xray.

## 2022-04-10 NOTE — Assessment & Plan Note (Addendum)
Physical exam complete. Lab work as outlined. Will contact patient with results. Mammo and colon due. Vaccines UTD- declines flu vaccine. Encouraged healthy diet, decreasing sugar intake, and exercise.

## 2022-04-11 ENCOUNTER — Other Ambulatory Visit: Payer: 59

## 2022-04-12 ENCOUNTER — Other Ambulatory Visit: Payer: 59

## 2022-04-13 ENCOUNTER — Other Ambulatory Visit (INDEPENDENT_AMBULATORY_CARE_PROVIDER_SITE_OTHER): Payer: 59

## 2022-04-13 DIAGNOSIS — Z114 Encounter for screening for human immunodeficiency virus [HIV]: Secondary | ICD-10-CM | POA: Diagnosis not present

## 2022-04-13 DIAGNOSIS — R6882 Decreased libido: Secondary | ICD-10-CM

## 2022-04-13 DIAGNOSIS — Z0000 Encounter for general adult medical examination without abnormal findings: Secondary | ICD-10-CM | POA: Diagnosis not present

## 2022-04-13 LAB — CBC WITH DIFFERENTIAL/PLATELET
Basophils Absolute: 0 10*3/uL (ref 0.0–0.1)
Basophils Relative: 0.7 % (ref 0.0–3.0)
Eosinophils Absolute: 0.2 10*3/uL (ref 0.0–0.7)
Eosinophils Relative: 3 % (ref 0.0–5.0)
HCT: 36.6 % (ref 36.0–46.0)
Hemoglobin: 12.2 g/dL (ref 12.0–15.0)
Lymphocytes Relative: 33 % (ref 12.0–46.0)
Lymphs Abs: 2.1 10*3/uL (ref 0.7–4.0)
MCHC: 33.3 g/dL (ref 30.0–36.0)
MCV: 93.9 fl (ref 78.0–100.0)
Monocytes Absolute: 0.4 10*3/uL (ref 0.1–1.0)
Monocytes Relative: 6.6 % (ref 3.0–12.0)
Neutro Abs: 3.6 10*3/uL (ref 1.4–7.7)
Neutrophils Relative %: 56.7 % (ref 43.0–77.0)
Platelets: 252 10*3/uL (ref 150.0–400.0)
RBC: 3.9 Mil/uL (ref 3.87–5.11)
RDW: 14.2 % (ref 11.5–15.5)
WBC: 6.4 10*3/uL (ref 4.0–10.5)

## 2022-04-13 LAB — COMPREHENSIVE METABOLIC PANEL
ALT: 10 U/L (ref 0–35)
AST: 10 U/L (ref 0–37)
Albumin: 4.4 g/dL (ref 3.5–5.2)
Alkaline Phosphatase: 73 U/L (ref 39–117)
BUN: 17 mg/dL (ref 6–23)
CO2: 28 mEq/L (ref 19–32)
Calcium: 9.3 mg/dL (ref 8.4–10.5)
Chloride: 102 mEq/L (ref 96–112)
Creatinine, Ser: 0.67 mg/dL (ref 0.40–1.20)
GFR: 102.92 mL/min (ref >60.00)
Glucose, Bld: 95 mg/dL (ref 70–99)
Potassium: 4.3 mEq/L (ref 3.5–5.1)
Sodium: 142 mEq/L (ref 135–145)
Total Bilirubin: 0.2 mg/dL (ref 0.2–1.2)
Total Protein: 7.2 g/dL (ref 6.0–8.3)

## 2022-04-13 LAB — LIPID PANEL
Cholesterol: 173 mg/dL (ref 0–200)
HDL: 50.9 mg/dL (ref >39.00)
LDL Cholesterol: 111 mg/dL — ABNORMAL HIGH (ref 0–99)
NonHDL: 121.86
Total CHOL/HDL Ratio: 3
Triglycerides: 53 mg/dL (ref 0.0–149.0)
VLDL: 10.6 mg/dL (ref 0.0–40.0)

## 2022-04-13 LAB — B12 AND FOLATE PANEL
Folate: 8.9 ng/mL (ref >5.9)
Vitamin B-12: 354 pg/mL (ref 211–911)

## 2022-04-13 LAB — TSH: TSH: 1.73 u[IU]/mL (ref 0.35–5.50)

## 2022-04-13 LAB — LUTEINIZING HORMONE: LH: 9.69 m[IU]/mL

## 2022-04-13 LAB — FOLLICLE STIMULATING HORMONE: FSH: 19.5 m[IU]/mL

## 2022-04-13 LAB — TESTOSTERONE: Testosterone: 36.72 ng/dL (ref 15.00–40.00)

## 2022-04-13 LAB — HEMOGLOBIN A1C: Hgb A1c MFr Bld: 5.7 % (ref 4.6–6.5)

## 2022-04-13 LAB — VITAMIN D 25 HYDROXY (VIT D DEFICIENCY, FRACTURES): VITD: 58.06 ng/mL (ref 30.00–100.00)

## 2022-04-15 LAB — HIV ANTIBODY (ROUTINE TESTING W REFLEX): HIV 1&2 Ab, 4th Generation: NONREACTIVE

## 2022-04-15 LAB — ESTRADIOL: Estradiol: 38 pg/mL

## 2022-04-17 ENCOUNTER — Telehealth: Payer: Self-pay

## 2022-04-17 NOTE — Telephone Encounter (Signed)
Returned patients call to schedule her colonoscopy.  Left voice message for her to call me back to schedule.  Thanks,  [place], [place]

## 2022-05-14 ENCOUNTER — Telehealth: Payer: Self-pay

## 2022-05-14 DIAGNOSIS — Z1211 Encounter for screening for malignant neoplasm of colon: Secondary | ICD-10-CM

## 2022-05-14 NOTE — Telephone Encounter (Signed)
Gastroenterology Pre-Procedure Review  Request Date: 06/18/22 Requesting Physician: Dr. Marius Ditch  PATIENT REVIEW QUESTIONS: The patient responded to the following health history questions as indicated:    1. Are you having any GI issues? no 2. Do you have a personal history of Polyps? no 3. Do you have a family history of Colon Cancer or Polyps? no 4. Diabetes Mellitus? no 5. Joint replacements in the past 12 months?no 6. Major health problems in the past 3 months?no 7. Any artificial heart valves, MVP, or defibrillator?no    MEDICATIONS & ALLERGIES:    Patient reports the following regarding taking any anticoagulation/antiplatelet therapy:   Plavix, Coumadin, Eliquis, Xarelto, Lovenox, Pradaxa, Brilinta, or Effient? no Aspirin? no  Patient confirms/reports the following medications:  Current Outpatient Medications  Medication Sig Dispense Refill   albuterol (VENTOLIN HFA) 108 (90 Base) MCG/ACT inhaler Inhale 1-2 puffs into the lungs every 6 (six) hours as needed for wheezing or shortness of breath. 54 g 3   cholecalciferol (VITAMIN D3) 25 MCG (1000 UNIT) tablet Take 1,000 Units by mouth daily. Pt taking 3 daily     No current facility-administered medications for this visit.   Facility-Administered Medications Ordered in Other Visits  Medication Dose Route Frequency Provider Last Rate Last Admin   lidocaine-EPINEPHrine 1.5 %-1:200000 injection    Anesthesia Intra-op Gunnar Fusi, MD   3 mL at 03/29/15 2035    Patient confirms/reports the following allergies:  No Known Allergies  No orders of the defined types were placed in this encounter.   AUTHORIZATION INFORMATION Primary Insurance: 1D#: Group #:  Secondary Insurance: 1D#: Group #:  SCHEDULE INFORMATION: Date: 06/18/22 Time: Location:

## 2022-06-04 ENCOUNTER — Ambulatory Visit
Admission: RE | Admit: 2022-06-04 | Discharge: 2022-06-04 | Disposition: A | Discharge status: Home / Self Care | Payer: Managed Care, Other (non HMO) | Source: Ambulatory Visit | Attending: Nurse Practitioner | Admitting: Nurse Practitioner

## 2022-06-04 DIAGNOSIS — Z1231 Encounter for screening mammogram for malignant neoplasm of breast: Secondary | ICD-10-CM | POA: Insufficient documentation

## 2022-06-15 ENCOUNTER — Other Ambulatory Visit: Payer: Self-pay

## 2022-06-15 DIAGNOSIS — Z1211 Encounter for screening for malignant neoplasm of colon: Secondary | ICD-10-CM

## 2022-06-18 ENCOUNTER — Encounter
Admission: RE | Disposition: A | Discharge status: Home / Self Care | Payer: Self-pay | Source: Home / Self Care | Attending: Gastroenterology

## 2022-06-18 ENCOUNTER — Ambulatory Visit: Payer: Managed Care, Other (non HMO) | Admitting: Anesthesiology

## 2022-06-18 ENCOUNTER — Ambulatory Visit
Admission: RE | Admit: 2022-06-18 | Discharge: 2022-06-18 | Disposition: A | Discharge status: Home / Self Care | Payer: Managed Care, Other (non HMO) | Attending: Gastroenterology | Admitting: Gastroenterology

## 2022-06-18 DIAGNOSIS — E559 Vitamin D deficiency, unspecified: Secondary | ICD-10-CM | POA: Diagnosis not present

## 2022-06-18 DIAGNOSIS — Z1211 Encounter for screening for malignant neoplasm of colon: Secondary | ICD-10-CM

## 2022-06-18 HISTORY — PX: COLONOSCOPY WITH PROPOFOL: SHX5780

## 2022-06-18 LAB — POCT PREGNANCY, URINE: Preg Test, Ur: NEGATIVE

## 2022-06-18 SURGERY — COLONOSCOPY WITH PROPOFOL
Anesthesia: General

## 2022-06-18 MED ORDER — PROPOFOL 500 MG/50ML IV EMUL
INTRAVENOUS | Status: DC | PRN
  Administered 2022-06-18: 100 ug/kg/min via INTRAVENOUS

## 2022-06-18 MED ORDER — LIDOCAINE HCL (CARDIAC) PF 100 MG/5ML IV SOSY
PREFILLED_SYRINGE | INTRAVENOUS | Status: DC | PRN
  Administered 2022-06-18: 80 mg via INTRAVENOUS

## 2022-06-18 MED ORDER — PROPOFOL 10 MG/ML IV BOLUS
INTRAVENOUS | Status: DC | PRN
  Administered 2022-06-18: 80 mg via INTRAVENOUS
  Administered 2022-06-18: 30 mg via INTRAVENOUS

## 2022-06-18 MED ORDER — SODIUM CHLORIDE 0.9 % IV SOLN
INTRAVENOUS | Status: DC
  Administered 2022-06-18: 20 mL/h via INTRAVENOUS

## 2022-06-18 NOTE — Transfer of Care (Signed)
Immediate Anesthesia Transfer of Care Note  Patient: Marilyn Hamilton  Procedure(s) Performed: COLONOSCOPY WITH PROPOFOL  Patient Location: PACU  Anesthesia Type:General  Level of Consciousness: awake, alert , and oriented  Airway & Oxygen Therapy: Patient Spontanous Breathing  Post-op Assessment: Report given to RN and Post -op Vital signs reviewed and stable  Post vital signs: stable  Last Vitals:  Vitals Value Taken Time  BP 125/85 06/18/22 1143  Temp 36 C 06/18/22 1143  Pulse 81 06/18/22 1143  Resp 21 06/18/22 1143  SpO2 100 % 06/18/22 1143    Last Pain:  Vitals:   06/18/22 1143  TempSrc: Temporal  PainSc: 0-No pain         Complications: No notable events documented.

## 2022-06-18 NOTE — H&P (Signed)
Marilyn Repress, MD [address]  [address]  [place], [place] [address]  Main: [phone]  Fax: [phone] Pager: [phone]  Primary Care Physician:  Bethanie Dicker, NP Primary Gastroenterologist:  Dr. Arlyss Hamilton  Pre-Procedure History & Physical: HPI:  Marilyn Hamilton is a 47 y.o. female is here for an colonoscopy.   Past Medical History:  Diagnosis Date   AMA (advanced maternal age) multigravida 35+    Chicken pox    Diverticulitis    Low back pain    UTI (urinary tract infection)    Vitamin D deficiency     Past Surgical History:  Procedure Laterality Date   CESAREAN SECTION N/A 03/30/2015   Procedure: CESAREAN SECTION, BTL;  Surgeon: Nadara Mustard, MD;  Location: [facility] ORS;  Service: Obstetrics;  Laterality: N/A;   NO PAST SURGERIES      Prior to Admission medications   Medication Sig Start Date End Date Taking? Authorizing Provider  albuterol (VENTOLIN HFA) 108 (90 Base) MCG/ACT inhaler Inhale 1-2 puffs into the lungs every 6 (six) hours as needed for wheezing or shortness of breath. 03/13/22  Yes Worthy Rancher B, FNP  cholecalciferol (VITAMIN D3) 25 MCG (1000 UNIT) tablet Take 1,000 Units by mouth daily. Pt taking 3 daily   Yes [provider]    Allergies as of 06/15/2022   (No Known Allergies)    Family History  Problem Relation Age of Onset   Depression Mother    Hypertension Mother    Hypertension Father    Alcohol abuse Father    Heart disease Maternal Grandmother    Hypertension Maternal Grandmother    Hypertension Maternal Grandfather    Cancer Maternal Grandfather     Social History   Socioeconomic History   Marital status: Married    Spouse name: Not on file   Number of children: Not on file   Years of education: Not on file   Highest education level: Not on file  Occupational History   Not on file  Tobacco Use   Smoking status: Never   Smokeless tobacco: Never  Substance and Sexual Activity   Alcohol use: No    Drug use: No   Sexual activity: Yes  Other Topics Concern   Not on file  Social History Narrative   Married    3 kids 48 y.o, 43 y.o and 2 y.o as of 06/2017    Never smoker    Wears selt belt, safe in relationship    No guns at home    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    Social Determinants of Health   Financial Resource Strain: Not on file  Food Insecurity: Not on file  Transportation Needs: Not on file  Physical Activity: Not on file  Stress: Not on file  Social Connections: Not on file  Intimate Partner Violence: Not on file    Review of Systems: See HPI, otherwise negative ROS  Physical Exam: BP 135/75   Pulse 69   Temp (!) 97.4 F (36.3 C) (Temporal)   Resp 20   Ht 5' 1.5" (1.562 m)   Wt 91.5 kg   LMP 05/28/2022 Comment: Urine Preg. Negative  SpO2 100%   BMI 37.51 kg/m  General:   Alert,  pleasant and cooperative in NAD Head:  Normocephalic and atraumatic. Neck:  Supple; no masses or thyromegaly. Lungs:  Clear throughout to auscultation.    Heart:  Regular rate and rhythm. Abdomen:  Soft, nontender and  nondistended. Normal bowel sounds, without guarding, and without rebound.   Neurologic:  Alert and  oriented x4;  grossly normal neurologically.  Impression/Plan: Marilyn Hamilton is here for an colonoscopy to be performed for colon cancer screening  Risks, benefits, limitations, and alternatives regarding  colonoscopy have been reviewed with the patient.  Questions have been answered.  All parties agreeable.   Lannette Donath, MD  06/18/2022, 10:48 AM

## 2022-06-18 NOTE — Anesthesia Postprocedure Evaluation (Signed)
Anesthesia Post Note  Patient: Marilyn Hamilton  Procedure(s) Performed: COLONOSCOPY WITH PROPOFOL  Patient location during evaluation: Endoscopy Anesthesia Type: General Level of consciousness: awake and alert Pain management: pain level controlled Vital Signs Assessment: post-procedure vital signs reviewed and stable Respiratory status: spontaneous breathing, nonlabored ventilation, respiratory function stable and patient connected to nasal cannula oxygen Cardiovascular status: blood pressure returned to baseline and stable Postop Assessment: no apparent nausea or vomiting Anesthetic complications: no   No notable events documented.   Last Vitals:  Vitals:   06/18/22 1155 06/18/22 1205  BP: 133/77 (!) 142/76  Pulse: 71 60  Resp: 14 16  Temp:    SpO2: 100% 100%    Last Pain:  Vitals:   06/18/22 1205  TempSrc:   PainSc: 0-No pain                 Louie Boston

## 2022-06-18 NOTE — Op Note (Signed)
[facility] Gastroenterology Patient Name: Marilyn Hamilton Procedure Date: 06/18/2022 11:25 AM MRN: [id number] Account #: [id number] Date of Birth: January 08, 1976 Admit Type: Outpatient Age: 47 Room: [facility] ENDO ROOM 4 Gender: Female Note Status: Finalized Instrument Name: Prentice Docker [id number] Procedure:             Colonoscopy Indications:           Screening for colorectal malignant neoplasm, This is                         the patient's first colonoscopy Providers:             Toney Reil MD, MD Referring MD:          No Local Md, MD (Referring MD) Medicines:             General Anesthesia Complications:         No immediate complications. Estimated blood loss: None. Procedure:             Pre-Anesthesia Assessment:                        - Prior to the procedure, a History and Physical was                         performed, and patient medications and allergies were                         reviewed. The patient is competent. The risks and                         benefits of the procedure and the sedation options and                         risks were discussed with the patient. All questions                         were answered and informed consent was obtained.                         Patient identification and proposed procedure were                         verified by the physician, the nurse, the                         anesthesiologist, the anesthetist and the technician                         in the pre-procedure area in the procedure room in the                         endoscopy suite. Mental Status Examination: alert and                         oriented. Airway Examination: normal oropharyngeal                         airway and neck mobility. Respiratory Examination:  clear to auscultation. CV Examination: normal.                         Prophylactic Antibiotics: The patient does not require                         prophylactic  antibiotics. Prior Anticoagulants: The                         patient has taken no anticoagulant or antiplatelet                         agents. ASA Grade Assessment: II - A patient with mild                         systemic disease. After reviewing the risks and                         benefits, the patient was deemed in satisfactory                         condition to undergo the procedure. The anesthesia                         plan was to use general anesthesia. Immediately prior                         to administration of medications, the patient was                         re-assessed for adequacy to receive sedatives. The                         heart rate, respiratory rate, oxygen saturations,                         blood pressure, adequacy of pulmonary ventilation, and                         response to care were monitored throughout the                         procedure. The physical status of the patient was                         re-assessed after the procedure.                        After obtaining informed consent, the colonoscope was                         passed under direct vision. Throughout the procedure,                         the patient's blood pressure, pulse, and oxygen                         saturations were monitored continuously. The  Colonoscope was introduced through the anus and                         advanced to the the cecum, identified by appendiceal                         orifice and ileocecal valve. The colonoscopy was                         performed without difficulty. The patient tolerated                         the procedure well. The quality of the bowel                         preparation was evaluated using the BBPS [facility] Bowel                         Preparation Scale) with scores of: Right Colon = 3,                         Transverse Colon = 3 and Left Colon = 3 (entire mucosa                         seen  well with no residual staining, small fragments                         of stool or opaque liquid). The total BBPS score                         equals 9. The ileocecal valve, appendiceal orifice,                         and rectum were photographed. Findings:      The perianal and digital rectal examinations were normal. Pertinent       negatives include normal sphincter tone and no palpable rectal lesions.      The entire examined colon appeared normal.      The retroflexed view of the distal rectum and anal verge was normal and       showed no anal or rectal abnormalities. Impression:            - The entire examined colon is normal.                        - The distal rectum and anal verge are normal on                         retroflexion view.                        - No specimens collected. Recommendation:        - Discharge patient to home (with escort).                        - Resume previous diet today.                        -  Continue present medications.                        - Repeat colonoscopy in 10 years for screening                         purposes. Procedure Code(s):     --- Professional ---                        D6644, Colorectal cancer screening; colonoscopy on                         individual not meeting criteria for high risk Diagnosis Code(s):     --- Professional ---                        Z12.11, Encounter for screening for malignant neoplasm                         of colon CPT copyright 2022 American Medical Association. All rights reserved. The codes documented in this report are preliminary and upon coder review may  be revised to meet current compliance requirements. Dr. Libby Maw Toney Reil MD, MD 06/18/2022 11:47:15 AM This report has been signed electronically. Number of Addenda: 0 Note Initiated On: 06/18/2022 11:25 AM Scope Withdrawal Time: 0 hours 9 minutes 26 seconds  Total Procedure Duration: 0 hours 12 minutes 3 seconds   Estimated Blood Loss:  Estimated blood loss: none.      [facility]

## 2022-06-18 NOTE — Anesthesia Preprocedure Evaluation (Signed)
Anesthesia Evaluation  Patient identified by MRN, date of birth, ID band Patient awake    Reviewed: Allergy & Precautions, NPO status , Patient's Chart, lab work & pertinent test results  History of Anesthesia Complications Negative for: history of anesthetic complications  Airway Mallampati: III  TM Distance: >3 FB Neck ROM: full    Dental no notable dental hx.    Pulmonary asthma    Pulmonary exam normal        Cardiovascular negative cardio ROS Normal cardiovascular exam     Neuro/Psych negative neurological ROS  negative psych ROS   GI/Hepatic negative GI ROS, Neg liver ROS,,,  Endo/Other  negative endocrine ROS    Renal/GU negative Renal ROS  negative genitourinary   Musculoskeletal   Abdominal   Peds  Hematology negative hematology ROS (+)   Anesthesia Other Findings Past Medical History: No date: AMA (advanced maternal age) multigravida 35+ No date: Chicken pox No date: Diverticulitis No date: Low back pain No date: UTI (urinary tract infection) No date: Vitamin D deficiency  Past Surgical History: 03/30/2015: CESAREAN SECTION; N/A     Comment:  Procedure: CESAREAN SECTION, BTL;  Surgeon: Nadara Mustard, MD;  Location: [facility] ORS;  Service: Obstetrics;                Laterality: N/A; No date: NO PAST SURGERIES  BMI    Body Mass Index: 37.51 kg/m      Reproductive/Obstetrics negative OB ROS                             Anesthesia Physical Anesthesia Plan  ASA: 2  Anesthesia Plan: General   Post-op Pain Management: Minimal or no pain anticipated   Induction: Intravenous  PONV Risk Score and Plan: 2 and Propofol infusion and TIVA  Airway Management Planned: Natural Airway and Nasal Cannula  Additional Equipment:   Intra-op Plan:   Post-operative Plan:   Informed Consent: I have reviewed the patients History and Physical, chart, labs and  discussed the procedure including the risks, benefits and alternatives for the proposed anesthesia with the patient or authorized representative who has indicated his/her understanding and acceptance.     Dental Advisory Given  Plan Discussed with: Anesthesiologist, CRNA and Surgeon  Anesthesia Plan Comments: (Patient consented for risks of anesthesia including but not limited to:  - adverse reactions to medications - risk of airway placement if required - damage to eyes, teeth, lips or other oral mucosa - nerve damage due to positioning  - sore throat or hoarseness - Damage to heart, brain, nerves, lungs, other parts of body or loss of life  Patient voiced understanding.)       Anesthesia Quick Evaluation

## 2022-06-19 ENCOUNTER — Encounter: Payer: Self-pay | Admitting: Gastroenterology

## 2022-06-21 ENCOUNTER — Encounter: Payer: Self-pay | Admitting: Gastroenterology

## 2022-11-22 ENCOUNTER — Ambulatory Visit: Payer: Managed Care, Other (non HMO) | Admitting: Nurse Practitioner

## 2022-11-22 ENCOUNTER — Encounter: Payer: Self-pay | Admitting: Nurse Practitioner

## 2022-11-22 ENCOUNTER — Other Ambulatory Visit (HOSPITAL_COMMUNITY)
Admission: RE | Admit: 2022-11-22 | Discharge: 2022-11-22 | Disposition: A | Discharge status: Home / Self Care | Payer: Managed Care, Other (non HMO) | Source: Ambulatory Visit | Attending: Nurse Practitioner | Admitting: Nurse Practitioner

## 2022-11-22 ENCOUNTER — Telehealth: Payer: Self-pay

## 2022-11-22 VITALS — BP 134/80 | HR 70 | Temp 98.9°F | Ht 61.5 in | Wt 204.6 lb

## 2022-11-22 DIAGNOSIS — M545 Low back pain, unspecified: Secondary | ICD-10-CM | POA: Diagnosis not present

## 2022-11-22 DIAGNOSIS — N898 Other specified noninflammatory disorders of vagina: Secondary | ICD-10-CM | POA: Diagnosis present

## 2022-11-22 DIAGNOSIS — R102 Pelvic and perineal pain: Secondary | ICD-10-CM

## 2022-11-22 MED ORDER — METHYLPREDNISOLONE 4 MG PO TBPK
ORAL_TABLET | ORAL | 0 refills | Status: DC
Start: 2022-11-22 — End: 2022-11-22

## 2022-11-22 MED ORDER — NAPROXEN 500 MG PO TABS
500.0000 mg | ORAL_TABLET | Freq: Two times a day (BID) | ORAL | 0 refills | Status: DC
Start: 2022-11-22 — End: 2023-07-16

## 2022-11-22 MED ORDER — NAPROXEN 500 MG PO TABS
500.0000 mg | ORAL_TABLET | Freq: Two times a day (BID) | ORAL | 0 refills | Status: DC
Start: 2022-11-22 — End: 2022-11-22

## 2022-11-22 MED ORDER — METHYLPREDNISOLONE 4 MG PO TBPK
ORAL_TABLET | ORAL | 0 refills | Status: DC
Start: 2022-11-22 — End: 2022-12-18

## 2022-11-22 NOTE — Telephone Encounter (Signed)
Patient states she would like for [place] to change her pharmacy from [facility] to [facility] on [address] in [place] because [facility] is no longer accepting her insurance.

## 2022-11-22 NOTE — Progress Notes (Signed)
Bethanie Dicker, NP-C Phone: [phone]  Marilyn Hamilton is a 47 y.o. female who presents today for vaginal itching and back pain.   Vaginitis: Patient complains of an abnormal vaginal discharge off and on for 2 months. It has improved today. She continues to have vaginal symptoms include local irritation and vulvar itching.Vulvar symptoms include local irritation and vulvar itching.STI Risk: Very low risk of STD exposureDischarge described as: white.Other associated symptoms: none.Menstrual pattern: She had been bleeding regularly. Contraception: tubal ligation. She has tried OTC Monistat cream and vaginal inserts without relief of vulvar itching and irritation. She also notes left sided pelvic pain that is worse with climax. She describes the pain as a stinging. Denies dyspareunia. Denies bleeding after intercourse.   Back Pain: Patient presents for presents evaluation of low back problems.  Symptoms have been present for 2 months and include pain in low back that feels like it is catching, aching. Initial inciting event:  got a new mattress and has been picking up her grandchildren . Symptoms are worst: nighttime. Alleviating factors identifiable by patient are walking. Exacerbating factors identifiable by patient are recumbency and sitting to standing . Treatments so far initiated by patient:  Ibuprofen and Tylenol    Social History   Tobacco Use  Smoking Status Never  Smokeless Tobacco Never    Current Outpatient Medications on File Prior to Visit  Medication Sig Dispense Refill   cholecalciferol (VITAMIN D3) 25 MCG (1000 UNIT) tablet Take 1,000 Units by mouth daily.     Current Facility-Administered Medications on File Prior to Visit  Medication Dose Route Frequency Provider Last Rate Last Admin   lidocaine-EPINEPHrine 1.5 %-1:200000 injection    Anesthesia Intra-op Naomie Dean, MD   3 mL at 03/29/15 2035    ROS see history of present illness  Objective  Physical  Exam Vitals:   11/22/22 1110  BP: 134/80  Pulse: 70  Temp: 98.9 F (37.2 C)  SpO2: 98%    BP Readings from Last 3 Encounters:  11/22/22 134/80  06/18/22 (!) 142/76  04/10/22 136/80   Wt Readings from Last 3 Encounters:  11/22/22 204 lb 9.6 oz (92.8 kg)  06/18/22 201 lb 12.8 oz (91.5 kg)  04/10/22 201 lb (91.2 kg)    Physical Exam Constitutional:      General: She is not in acute distress.    Appearance: Normal appearance.  HENT:     Head: Normocephalic.  Cardiovascular:     Rate and Rhythm: Normal rate and regular rhythm.     Heart sounds: Normal heart sounds.  Pulmonary:     Effort: Pulmonary effort is normal.     Breath sounds: Normal breath sounds.  Musculoskeletal:     Lumbar back: Tenderness (midline) present. Decreased range of motion.  Skin:    General: Skin is warm and dry.  Neurological:     General: No focal deficit present.     Mental Status: She is alert.  Psychiatric:        Mood and Affect: Mood normal.        Behavior: Behavior normal.    Assessment/Plan: Please see individual problem list.  Vaginal discharge Assessment & Plan: Concern for yeast infection. Patient has treated x 2 with OTC Monistat. Vaginal swab obtained today, patient self swabbed. Discharge resolved today, itching and irritation only currently. Will contact patient with results.   Orders: -     Cervicovaginal ancillary only  Pelvic pain Assessment & Plan: Worse with climax, left side. Will  get transvaginal US for further evaluation.   Orders: -     US PELVIC COMPLETE WITH TRANSVAGINAL; Future  Acute midline low back pain without sciatica Assessment & Plan: Will treat with MDP and Naproxen. Encouraged to ice/heat painful area. She can continue Tylenol as needed. Advised OTC pain relief creams/patches as needed. She will contact if her symptoms persist or worsen for further work up/imaging.   Orders: -     Naproxen; Take 1 tablet (500 mg total) by mouth 2 (two) times  daily with a meal.  Dispense: 30 tablet; Refill: 0 -     methylPREDNISolone; Take as directed.  Dispense: 21 each; Refill: 0   Return if symptoms worsen or fail to improve.   Bethanie Dicker, NP-C [facility] - [facility]

## 2022-11-23 LAB — CERVICOVAGINAL ANCILLARY ONLY
Bacterial Vaginitis (gardnerella): NEGATIVE
Candida Glabrata: NEGATIVE
Candida Vaginitis: NEGATIVE
Chlamydia: NEGATIVE
Comment: NEGATIVE
Comment: NEGATIVE
Comment: NEGATIVE
Comment: NEGATIVE
Comment: NEGATIVE
Comment: NORMAL
Neisseria Gonorrhea: NEGATIVE
Trichomonas: NEGATIVE

## 2022-11-27 ENCOUNTER — Telehealth: Payer: Self-pay | Admitting: Nurse Practitioner

## 2022-11-27 ENCOUNTER — Other Ambulatory Visit: Payer: Self-pay | Admitting: Nurse Practitioner

## 2022-11-27 ENCOUNTER — Ambulatory Visit
Admission: RE | Admit: 2022-11-27 | Discharge: 2022-11-27 | Disposition: A | Discharge status: Home / Self Care | Payer: Managed Care, Other (non HMO) | Source: Ambulatory Visit | Attending: Nurse Practitioner | Admitting: Nurse Practitioner

## 2022-11-27 DIAGNOSIS — D259 Leiomyoma of uterus, unspecified: Secondary | ICD-10-CM

## 2022-11-27 DIAGNOSIS — R102 Pelvic and perineal pain: Secondary | ICD-10-CM | POA: Diagnosis present

## 2022-11-27 DIAGNOSIS — J4520 Mild intermittent asthma, uncomplicated: Secondary | ICD-10-CM

## 2022-11-27 MED ORDER — ALBUTEROL SULFATE HFA 108 (90 BASE) MCG/ACT IN AERS
1.0000 | INHALATION_SPRAY | Freq: Four times a day (QID) | RESPIRATORY_TRACT | 3 refills | Status: DC | PRN
Start: 2022-11-27 — End: 2024-01-28

## 2022-11-27 NOTE — Addendum Note (Signed)
Addended by: Donavan Foil on: 11/27/2022 08:35 AM   Modules accepted: Orders

## 2022-11-27 NOTE — Telephone Encounter (Signed)
Prescription Request  11/27/2022  LOV: 11/22/2022  What is the name of the medication or equipment? albuterol (VENTOLIN HFA) 108 (90 Base) MCG/ACT inhaler   Have you contacted your pharmacy to request a refill? Yes   Which pharmacy would you like this sent to?   [facility] [address]), [place] - [address] [place]) [place] [address] Phone: [phone] Fax: [phone]    Patient notified that their request is being sent to the clinical staff for review and that they should receive a response within 2 business days.   Please advise at Mobile [phone] (mobile)

## 2022-11-27 NOTE — Telephone Encounter (Signed)
Refill has been sent.

## 2022-11-30 NOTE — Assessment & Plan Note (Signed)
Worse with climax, left side. Will get transvaginal US for further evaluation.

## 2022-11-30 NOTE — Assessment & Plan Note (Signed)
Concern for yeast infection. Patient has treated x 2 with OTC Monistat. Vaginal swab obtained today, patient self swabbed. Discharge resolved today, itching and irritation only currently. Will contact patient with results.

## 2022-11-30 NOTE — Assessment & Plan Note (Addendum)
Will treat with MDP and Naproxen. Encouraged to ice/heat painful area. She can continue Tylenol as needed. Advised OTC pain relief creams/patches as needed. She will contact if her symptoms persist or worsen for further work up/imaging.

## 2022-12-17 NOTE — Progress Notes (Unsigned)
Bethanie Dicker, NP   No chief complaint on file.   HPI:      Ms. Marilyn Hamilton is a 47 y.o. [phone] whose LMP was No LMP recorded., presents today for NP eval    9/24 GYN u/s  Uterus--Measurements: 10 x 4.6 x 5 cm = volume: 119.8 mL. A 2.1 x 1.9 x 1.7 cm submucosal fibroid is identified in the posterior myometrium.   Endometrium--Thickness: 12.4 mm.  No focal abnormality visualized.   Neg pap/neg HPV DNA 12/22  Patient Active Problem List   Diagnosis Date Noted   Vaginal discharge 11/22/2022   Acute midline low back pain without sciatica 11/22/2022   Encounter for screening colonoscopy 06/18/2022   Low libido 04/10/2022   Varicose veins of both lower extremities with pain 10/15/2019   Bilateral impacted cerumen 10/15/2019   Acute pain of right shoulder 10/15/2019   Mild intermittent asthma without complication 03/10/2019   Preventative health care 02/11/2018   Vitamin D deficiency 02/11/2018   Chronic right-sided low back pain with right-sided sciatica 08/12/2017   Obesity (BMI 30-39.9) 07/09/2017   Pelvic pain 07/09/2017    Past Surgical History:  Procedure Laterality Date   CESAREAN SECTION N/A 03/30/2015   Procedure: CESAREAN SECTION, BTL;  Surgeon: Nadara Mustard, MD;  Location: [facility] ORS;  Service: Obstetrics;  Laterality: N/A;   COLONOSCOPY WITH PROPOFOL N/A 06/18/2022   Procedure: COLONOSCOPY WITH PROPOFOL;  Surgeon: Toney Reil, MD;  Location: [facility] ENDOSCOPY;  Service: Gastroenterology;  Laterality: N/A;   NO PAST SURGERIES      Family History  Problem Relation Age of Onset   Depression Mother    Hypertension Mother    Hypertension Father    Alcohol abuse Father    Heart disease Maternal Grandmother    Hypertension Maternal Grandmother    Hypertension Maternal Grandfather    Cancer Maternal Grandfather     Social History   Socioeconomic History   Marital status: Married    Spouse name: Not on file   Number of children: Not on file    Years of education: Not on file   Highest education level: Not on file  Occupational History   Not on file  Tobacco Use   Smoking status: Never   Smokeless tobacco: Never  Substance and Sexual Activity   Alcohol use: No   Drug use: No   Sexual activity: Yes  Other Topics Concern   Not on file  Social History Narrative   Married    3 kids 44 y.o, 52 y.o and 2 y.o as of 06/2017    Never smoker    Wears selt belt, safe in relationship    No guns at home    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    Social Determinants of Health   Financial Resource Strain: Not on file  Food Insecurity: Not on file  Transportation Needs: Not on file  Physical Activity: Not on file  Stress: Not on file  Social Connections: Not on file  Intimate Partner Violence: Not on file    Outpatient Medications Prior to Visit  Medication Sig Dispense Refill   albuterol (VENTOLIN HFA) 108 (90 Base) MCG/ACT inhaler Inhale 1-2 puffs into the lungs every 6 (six) hours as needed for wheezing or shortness of breath. 54 g 3   cholecalciferol (VITAMIN D3) 25 MCG (1000 UNIT) tablet Take 1,000 Units by mouth daily.     methylPREDNISolone (MEDROL DOSEPAK) 4 MG TBPK tablet Take as  directed. 21 each 0   naproxen (NAPROSYN) 500 MG tablet Take 1 tablet (500 mg total) by mouth 2 (two) times daily with a meal. 30 tablet 0   Facility-Administered Medications Prior to Visit  Medication Dose Route Frequency Provider Last Rate Last Admin   lidocaine-EPINEPHrine 1.5 %-1:200000 injection    Anesthesia Intra-op Naomie Dean, MD   3 mL at 03/29/15 2035      ROS:  Review of Systems BREAST: No symptoms   OBJECTIVE:   Vitals:  There were no vitals taken for this visit.  Physical Exam  Results: No results found for this or any previous visit (from the past 24 hour(s)).   Assessment/Plan: No diagnosis found.    No orders of the defined types were placed in this encounter.     No follow-ups on file.  Mandisa Persinger B.  Sashia Campas, PA-C 12/17/2022 4:35 PM

## 2022-12-18 ENCOUNTER — Encounter: Payer: Self-pay | Admitting: Obstetrics and Gynecology

## 2022-12-18 ENCOUNTER — Ambulatory Visit: Payer: Managed Care, Other (non HMO) | Admitting: Obstetrics and Gynecology

## 2022-12-18 VITALS — BP 120/75 | HR 67 | Ht 64.0 in | Wt 205.0 lb

## 2022-12-18 DIAGNOSIS — M545 Low back pain, unspecified: Secondary | ICD-10-CM

## 2022-12-18 DIAGNOSIS — R1032 Left lower quadrant pain: Secondary | ICD-10-CM | POA: Diagnosis not present

## 2022-12-18 DIAGNOSIS — D259 Leiomyoma of uterus, unspecified: Secondary | ICD-10-CM

## 2022-12-18 DIAGNOSIS — D219 Benign neoplasm of connective and other soft tissue, unspecified: Secondary | ICD-10-CM

## 2022-12-18 NOTE — Patient Instructions (Signed)
I value your feedback and you entrusting us with your care. If you get a [facility] patient survey, I would appreciate you taking the time to let us know about your experience today. Thank you! ? ? ?

## 2023-04-16 ENCOUNTER — Ambulatory Visit (INDEPENDENT_AMBULATORY_CARE_PROVIDER_SITE_OTHER): Payer: 59 | Admitting: Nurse Practitioner

## 2023-04-16 ENCOUNTER — Encounter: Payer: Managed Care, Other (non HMO) | Admitting: Nurse Practitioner

## 2023-04-16 ENCOUNTER — Encounter: Payer: Self-pay | Admitting: Nurse Practitioner

## 2023-04-16 VITALS — BP 130/76 | HR 66 | Ht 63.75 in | Wt 202.0 lb

## 2023-04-16 DIAGNOSIS — M5441 Lumbago with sciatica, right side: Secondary | ICD-10-CM | POA: Diagnosis not present

## 2023-04-16 DIAGNOSIS — Z0000 Encounter for general adult medical examination without abnormal findings: Secondary | ICD-10-CM

## 2023-04-16 DIAGNOSIS — Z0001 Encounter for general adult medical examination with abnormal findings: Secondary | ICD-10-CM

## 2023-04-16 DIAGNOSIS — Z13220 Encounter for screening for lipoid disorders: Secondary | ICD-10-CM

## 2023-04-16 DIAGNOSIS — G8929 Other chronic pain: Secondary | ICD-10-CM | POA: Diagnosis not present

## 2023-04-16 DIAGNOSIS — E559 Vitamin D deficiency, unspecified: Secondary | ICD-10-CM | POA: Diagnosis not present

## 2023-04-16 DIAGNOSIS — Z1329 Encounter for screening for other suspected endocrine disorder: Secondary | ICD-10-CM | POA: Diagnosis not present

## 2023-04-16 DIAGNOSIS — E669 Obesity, unspecified: Secondary | ICD-10-CM | POA: Diagnosis not present

## 2023-04-16 LAB — CBC WITH DIFFERENTIAL/PLATELET
Basophils Absolute: 0 10*3/uL (ref 0.0–0.1)
Basophils Relative: 1 % (ref 0.0–3.0)
Eosinophils Absolute: 0.1 10*3/uL (ref 0.0–0.7)
Eosinophils Relative: 2.1 % (ref 0.0–5.0)
HCT: 34.3 % — ABNORMAL LOW (ref 36.0–46.0)
Hemoglobin: 11.1 g/dL — ABNORMAL LOW (ref 12.0–15.0)
Lymphocytes Relative: 40.5 % (ref 12.0–46.0)
Lymphs Abs: 2 10*3/uL (ref 0.7–4.0)
MCHC: 32.5 g/dL (ref 30.0–36.0)
MCV: 94.5 fL (ref 78.0–100.0)
Monocytes Absolute: 0.5 10*3/uL (ref 0.1–1.0)
Monocytes Relative: 9.5 % (ref 3.0–12.0)
Neutro Abs: 2.3 10*3/uL (ref 1.4–7.7)
Neutrophils Relative %: 46.9 % (ref 43.0–77.0)
Platelets: 235 10*3/uL (ref 150.0–400.0)
RBC: 3.63 Mil/uL — ABNORMAL LOW (ref 3.87–5.11)
RDW: 14.5 % (ref 11.5–15.5)
WBC: 5 10*3/uL (ref 4.0–10.5)

## 2023-04-16 LAB — COMPREHENSIVE METABOLIC PANEL
ALT: 9 U/L (ref 0–35)
AST: 12 U/L (ref 0–37)
Albumin: 4.1 g/dL (ref 3.5–5.2)
Alkaline Phosphatase: 58 U/L (ref 39–117)
BUN: 14 mg/dL (ref 6–23)
CO2: 27 meq/L (ref 19–32)
Calcium: 9.1 mg/dL (ref 8.4–10.5)
Chloride: 105 meq/L (ref 96–112)
Creatinine, Ser: 0.69 mg/dL (ref 0.40–1.20)
GFR: 101.47 mL/min (ref >60.00)
Glucose, Bld: 100 mg/dL — ABNORMAL HIGH (ref 70–99)
Potassium: 4.2 meq/L (ref 3.5–5.1)
Sodium: 139 meq/L (ref 135–145)
Total Bilirubin: 0.3 mg/dL (ref 0.2–1.2)
Total Protein: 6.9 g/dL (ref 6.0–8.3)

## 2023-04-16 LAB — LIPID PANEL
Cholesterol: 159 mg/dL (ref 0–200)
HDL: 53.8 mg/dL (ref >39.00)
LDL Cholesterol: 95 mg/dL (ref 0–99)
NonHDL: 105.58
Total CHOL/HDL Ratio: 3
Triglycerides: 55 mg/dL (ref 0.0–149.0)
VLDL: 11 mg/dL (ref 0.0–40.0)

## 2023-04-16 LAB — VITAMIN D 25 HYDROXY (VIT D DEFICIENCY, FRACTURES): VITD: 41.88 ng/mL (ref 30.00–100.00)

## 2023-04-16 LAB — HEMOGLOBIN A1C: Hgb A1c MFr Bld: 5.8 % (ref 4.6–6.5)

## 2023-04-16 LAB — TSH: TSH: 0.99 u[IU]/mL (ref 0.35–5.50)

## 2023-04-16 NOTE — Progress Notes (Signed)
 Leron Glance, NP-C Phone: [phone]  Marilyn Hamilton is a 48 y.o. female who presents today for annual exam.   Discussed the use of AI scribe software for clinical note transcription with the patient, who gave verbal consent to proceed.  History of Present Illness   Marilyn Hamilton is a 48 year old female who presents for an annual physical exam.  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 She misplaced her prescribed naproxen  and has been using Tylenol  instead. Recently, she purchased over-the-counter naproxen  and resumed taking it, which has improved her back pain. She also uses a heating pad and Salonpas for relief. No chest pain, shortness of breath, leg swelling, or headaches.  She experiences occasional shooting pain in her joints, which she associates with her weight and lack of physical activity. She wants to lose weight and become more active, noting that her previous neighborhood [place], but her new neighborhood [place], which she plans to take advantage of for biking and walking. No dizziness or trouble swallowing.  Her menstrual periods are regular but have become heavier and more crampy since having her tubes removed after giving birth at age 62. Her last menstrual period was around January 22nd. No abdominal pain, constipation, diarrhea, painful urination, abnormal discharge, or painful intercourse.  She has decreased her snacking due to having braces since December, which has limited her intake of candy and chips. She cooks at home and wants to improve her diet and increase physical activity. She has not been taking her over-the-counter vitamin D  regularly and wants to improve her adherence to this supplement. She sleeps well.      Social History   Tobacco Use  Smoking Status Never  Smokeless Tobacco Never    Current Outpatient Medications on File Prior to Visit  Medication Sig Dispense Refill    albuterol  (VENTOLIN  HFA) 108 (90 Base) MCG/ACT inhaler Inhale 1-2 puffs into the lungs every 6 (six) hours as needed for wheezing or shortness of breath. 54 g 3   cholecalciferol  (VITAMIN D3) 25 MCG (1000 UNIT) tablet Take 1,000 Units by mouth daily.     naproxen  (NAPROSYN ) 500 MG tablet Take 1 tablet (500 mg total) by mouth 2 (two) times daily with a meal. 30 tablet 0   Current Facility-Administered Medications on File Prior to Visit  Medication Dose Route Frequency Provider Last Rate Last Admin   lidocaine -EPINEPHrine  1.5 %-1:200000 injection    Anesthesia Intra-op Elie Elsie POUR, MD   3 mL at 03/29/15 2035     ROS see history of present illness  Objective  Physical Exam Vitals:   04/16/23 0840  BP: 130/76  Pulse: 66  SpO2: 99%    BP Readings from Last 3 Encounters:  04/16/23 130/76  12/18/22 120/75  11/22/22 134/80   Wt Readings from Last 3 Encounters:  04/16/23 202 lb (91.6 kg)  12/18/22 205 lb (93 kg)  11/22/22 204 lb 9.6 oz (92.8 kg)    Physical Exam Constitutional:      General: She is not in acute distress.    Appearance: Normal appearance.  HENT:     Head: Normocephalic.     Right Ear: Tympanic membrane normal.     Left Ear: Tympanic membrane normal.     Nose: Nose normal.     Mouth/Throat:     Mouth: Mucous membranes are moist.     Pharynx: Oropharynx is clear.  Eyes:  Conjunctiva/sclera: Conjunctivae normal.     Pupils: Pupils are equal, round, and reactive to light.  Neck:     Thyroid: No thyromegaly.  Cardiovascular:     Rate and Rhythm: Normal rate and regular rhythm.     Heart sounds: Normal heart sounds.  Pulmonary:     Effort: Pulmonary effort is normal.     Breath sounds: Normal breath sounds.  Abdominal:     General: Abdomen is flat. Bowel sounds are normal.     Palpations: Abdomen is soft. There is no mass.     Tenderness: There is no abdominal tenderness.  Musculoskeletal:        General: Normal range of motion.   Lymphadenopathy:     Cervical: No cervical adenopathy.  Skin:    General: Skin is warm and dry.     Findings: No rash.  Neurological:     General: No focal deficit present.     Mental Status: She is alert.  Psychiatric:        Mood and Affect: Mood normal.        Behavior: Behavior normal.    Assessment/Plan: Please see individual problem list.  Preventative health care Assessment & Plan: Physical exam complete. We will order routine lab work as outlined and contact patient with the results. Pap smear and colonoscopy are up to date. Order a mammogram due in March 2025. Politely declined the flu vaccine today. Tetanus shot is up to date. Discussed and recommended the Shingles vaccine. Continue routine dental and eye exams. Encouraged healthy diet and regular exercise. Return to care in one year, sooner as needed.   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 It has improved with over-the-counter Naproxen . Continue Naproxen  as needed for pain. Consider lifestyle modifications such as proper lifting techniques and supportive footwear.   Vitamin D  deficiency Assessment & Plan: There is non-compliance with over-the-counter Vitamin D  supplementation. Check Vitamin D  levels today and encourage resumption of regular Vitamin D  supplementation.  Orders: -     VITAMIN D  25 Hydroxy (Vit-D Deficiency, Fractures)  Obesity (BMI 30-39.9) -     Hemoglobin A1c  Thyroid disorder screen -     TSH  Lipid screening -     Lipid panel    Return in about 1 year (around 04/15/2024) for Annual Exam, sooner as needed.   Leron Glance, NP-C [facility] - [facility]

## 2023-04-18 ENCOUNTER — Encounter: Payer: Self-pay | Admitting: Nurse Practitioner

## 2023-04-22 ENCOUNTER — Other Ambulatory Visit: Payer: Self-pay

## 2023-04-22 ENCOUNTER — Telehealth: Payer: Self-pay

## 2023-04-22 DIAGNOSIS — D649 Anemia, unspecified: Secondary | ICD-10-CM

## 2023-04-22 NOTE — Telephone Encounter (Signed)
-----   Message from Bluford Burkitt sent at 04/18/2023  5:04 PM EST ----- See MyChart note. Needs lab appt in 3 months for repeat CBC and iron panel please.

## 2023-04-22 NOTE — Addendum Note (Signed)
 Addended by: Lindle Rhea on: 04/22/2023 02:31 PM   Modules accepted: Orders

## 2023-04-23 ENCOUNTER — Encounter: Payer: Self-pay | Admitting: Nurse Practitioner

## 2023-04-23 NOTE — Assessment & Plan Note (Signed)
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 It has improved with over-the-counter Naproxen. Continue Naproxen as needed for pain. Consider lifestyle modifications such as proper lifting techniques and supportive footwear.

## 2023-04-23 NOTE — Assessment & Plan Note (Signed)
There is non-compliance with over-the-counter Vitamin D supplementation. Check Vitamin D levels today and encourage resumption of regular Vitamin D supplementation.

## 2023-04-23 NOTE — Assessment & Plan Note (Signed)
Physical exam complete. We will order routine lab work as outlined and contact patient with the results. Pap smear and colonoscopy are up to date. Order a mammogram due in March 2025. Politely declined the flu vaccine today. Tetanus shot is up to date. Discussed and recommended the Shingles vaccine. Continue routine dental and eye exams. Encouraged healthy diet and regular exercise. Return to care in one year, sooner as needed.

## 2023-06-25 ENCOUNTER — Other Ambulatory Visit: Payer: Self-pay | Admitting: Nurse Practitioner

## 2023-06-25 DIAGNOSIS — Z1231 Encounter for screening mammogram for malignant neoplasm of breast: Secondary | ICD-10-CM

## 2023-07-09 ENCOUNTER — Ambulatory Visit
Admission: RE | Admit: 2023-07-09 | Discharge: 2023-07-09 | Disposition: A | Discharge status: Home / Self Care | Source: Ambulatory Visit | Attending: Nurse Practitioner | Admitting: Nurse Practitioner

## 2023-07-09 DIAGNOSIS — Z1231 Encounter for screening mammogram for malignant neoplasm of breast: Secondary | ICD-10-CM | POA: Insufficient documentation

## 2023-07-11 ENCOUNTER — Encounter: Payer: Self-pay | Admitting: Nurse Practitioner

## 2023-07-16 ENCOUNTER — Encounter: Payer: Self-pay | Admitting: Nurse Practitioner

## 2023-07-16 ENCOUNTER — Ambulatory Visit: Admitting: Nurse Practitioner

## 2023-07-16 VITALS — BP 110/66 | HR 75 | Temp 98.3°F | Ht 69.0 in | Wt 203.0 lb

## 2023-07-16 DIAGNOSIS — E669 Obesity, unspecified: Secondary | ICD-10-CM | POA: Diagnosis not present

## 2023-07-16 DIAGNOSIS — Z6829 Body mass index (BMI) 29.0-29.9, adult: Secondary | ICD-10-CM | POA: Diagnosis not present

## 2023-07-16 DIAGNOSIS — S29012A Strain of muscle and tendon of back wall of thorax, initial encounter: Secondary | ICD-10-CM | POA: Diagnosis not present

## 2023-07-16 MED ORDER — NAPROXEN 500 MG PO TABS: 500.0000 mg | ORAL_TABLET | Freq: Two times a day (BID) | ORAL | 2 refills | Status: AC | PRN

## 2023-07-16 MED ORDER — BACLOFEN 10 MG PO TABS
10.0000 mg | ORAL_TABLET | Freq: Three times a day (TID) | ORAL | 0 refills | Status: AC | PRN
Start: 2023-07-16 — End: ?

## 2023-07-16 MED ORDER — METHYLPREDNISOLONE 4 MG PO TBPK
ORAL_TABLET | ORAL | 0 refills | Status: AC
Start: 2023-07-16 — End: ?

## 2023-07-16 NOTE — Progress Notes (Addendum)
 Marilyn Burkitt, NP-C Phone: [phone]  Marilyn Hamilton is a 48 y.o. female who presents today for left sided back pain.   Discussed the use of AI scribe software for clinical note transcription with the patient, who gave verbal consent to proceed.  History of Present Illness   Marilyn Hamilton is a 48 year old female who presents with persistent flank pain.  She experiences intermittent left flank pain described as a 'catch' or muscle spasm for approximately five months. The pain is localized to the flank and does not radiate down her leg, unlike her previous sciatica pain. 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  She has attempted various treatments including heat, patches, topical rubs, and over-the-counter Aleve , which have only provided temporary relief. The pain is not exacerbated by pressure on the area but can become tight and uncomfortable, particularly after physical activity or lifting her grandchild.  No burning sensation during urination, no hematuria, and no pleuritic pain. Her past medical history includes low back pain and sciatica, which she describes as different from her current symptoms. She recalls being tender in the middle of her back due to a previous cesarean section and epidural.  She has previously been prescribed naproxen  but misplaced the medication.      Social History   Tobacco Use  Smoking Status Never  Smokeless Tobacco Never    Current Outpatient Medications on File Prior to Visit  Medication Sig Dispense Refill   albuterol  (VENTOLIN  HFA) 108 (90 Base) MCG/ACT inhaler Inhale 1-2 puffs into the lungs every 6 (six) hours as needed for wheezing or shortness of breath. 54 g 3   cholecalciferol  (VITAMIN D3) 25 MCG (1000 UNIT) tablet Take 1,000 Units by mouth daily.     Current Facility-Administered Medications on File Prior to Visit   Medication Dose Route Frequency Provider Last Rate Last Admin   lidocaine -EPINEPHrine  1.5 %-1:200000 injection    Anesthesia Intra-op Margie Sheller, MD   3 mL at 03/29/15 2035    ROS see history of present illness  Objective  Physical Exam Vitals:   07/16/23 1342  BP: 110/66  Pulse: 75  Temp: 98.3 F (36.8 C)  SpO2: 98%    BP Readings from Last 3 Encounters:  07/16/23 110/66  04/16/23 130/76  12/18/22 120/75   Wt Readings from Last 3 Encounters:  07/16/23 203 lb (92.1 kg)  04/16/23 202 lb (91.6 kg)  12/18/22 205 lb (93 kg)    Physical Exam Constitutional:      General: She is not in acute distress.    Appearance: Normal appearance.  HENT:     Head: Normocephalic.  Cardiovascular:     Rate and Rhythm: Normal rate and regular rhythm.     Heart sounds: Normal heart sounds.  Pulmonary:     Effort: Pulmonary effort is normal.     Breath sounds: Normal breath sounds.  Musculoskeletal:       Back:     Comments: No tenderness on palpation.   Skin:    General: Skin is warm and dry.  Neurological:     General: No focal deficit present.     Mental Status: She is alert.  Psychiatric:        Mood and Affect: Mood normal.        Behavior: Behavior normal.      Assessment/Plan: Please see individual problem list.  Strain of left latissimus dorsi muscle Assessment & Plan: Intermittent left lower back muscle strain/spasm for five months, likely musculoskeletal in nature. Previous treatments provided only temporary relief. Patient unable to leave urine specimen for UA. Prescribe a muscle relaxer for symptomatic relief, to be used at bedtime or as needed, and naproxen  500 mg for pain management. Continue heat and ice therapy as needed. Encourage exercise and stretching as tolerated, emphasizing the importance of staying active. Discuss the potential for a physical therapy referral if no improvement. Advise on weight management and core strengthening exercises.  Instruct to report persistent or worsening symptoms for further evaluation, including potential imaging.  Orders: -     methylPREDNISolone ; Take as directed.  Dispense: 21 each; Refill: 0 -     Baclofen; Take 1 tablet (10 mg total) by mouth 3 (three) times daily as needed for muscle spasms.  Dispense: 30 each; Refill: 0 -     Naproxen ; Take 1 tablet (500 mg total) by mouth 2 (two) times daily as needed.  Dispense: 30 tablet; Refill: 2  Obesity (BMI 30-39.9) Assessment & Plan: She desires to improve overall health, focusing on weight management and increased physical activity. Discussed challenges with water intake and sugar consumption. Encourage increased water intake. Advise reducing sugar intake for weight management. Recommend exploring different types of water to find a preferred option. Suggest incorporating weight training and core strengthening exercises into her routine.      Return if symptoms worsen or fail to improve.   Marilyn Burkitt, NP-C [facility] - [facility]

## 2023-07-16 NOTE — Assessment & Plan Note (Signed)
 She desires to improve overall health, focusing on weight management and increased physical activity. Discussed challenges with water intake and sugar consumption. Encourage increased water intake. Advise reducing sugar intake for weight management. Recommend exploring different types of water to find a preferred option. Suggest incorporating weight training and core strengthening exercises into her routine.

## 2023-07-16 NOTE — Assessment & Plan Note (Addendum)
 Intermittent left lower back muscle strain/spasm for five months, likely musculoskeletal in nature. Previous treatments provided only temporary relief. Patient unable to leave urine specimen for UA. Prescribe a muscle relaxer for symptomatic relief, to be used at bedtime or as needed, and naproxen  500 mg for pain management. Continue heat and ice therapy as needed. Encourage exercise and stretching as tolerated, emphasizing the importance of staying active. Discuss the potential for a physical therapy referral if no improvement. Advise on weight management and core strengthening exercises. Instruct to report persistent or worsening symptoms for further evaluation, including potential imaging.

## 2023-07-22 ENCOUNTER — Other Ambulatory Visit (INDEPENDENT_AMBULATORY_CARE_PROVIDER_SITE_OTHER): Payer: Managed Care, Other (non HMO)

## 2023-07-22 ENCOUNTER — Encounter: Payer: Self-pay | Admitting: Nurse Practitioner

## 2023-07-22 DIAGNOSIS — D649 Anemia, unspecified: Secondary | ICD-10-CM

## 2023-07-22 LAB — CBC WITH DIFFERENTIAL/PLATELET
Basophils Absolute: 0 10*3/uL (ref 0.0–0.1)
Basophils Relative: 0.6 % (ref 0.0–3.0)
Eosinophils Absolute: 0.2 10*3/uL (ref 0.0–0.7)
Eosinophils Relative: 2 % (ref 0.0–5.0)
HCT: 35.8 % — ABNORMAL LOW (ref 36.0–46.0)
Hemoglobin: 11.8 g/dL — ABNORMAL LOW (ref 12.0–15.0)
Lymphocytes Relative: 35.5 % (ref 12.0–46.0)
Lymphs Abs: 2.7 10*3/uL (ref 0.7–4.0)
MCHC: 33 g/dL (ref 30.0–36.0)
MCV: 94.3 fl (ref 78.0–100.0)
Monocytes Absolute: 0.6 10*3/uL (ref 0.1–1.0)
Monocytes Relative: 8 % (ref 3.0–12.0)
Neutro Abs: 4.2 10*3/uL (ref 1.4–7.7)
Neutrophils Relative %: 53.9 % (ref 43.0–77.0)
Platelets: 267 10*3/uL (ref 150.0–400.0)
RBC: 3.8 Mil/uL — ABNORMAL LOW (ref 3.87–5.11)
RDW: 13.8 % (ref 11.5–15.5)
WBC: 7.7 10*3/uL (ref 4.0–10.5)

## 2023-07-22 LAB — IBC + FERRITIN
Ferritin: 33.5 ng/mL (ref 10.0–291.0)
Iron: 68 ug/dL (ref 42–145)
Saturation Ratios: 21.2 % (ref 20.0–50.0)
TIBC: 320.6 ug/dL (ref 250.0–450.0)
Transferrin: 229 mg/dL (ref 212.0–360.0)

## 2024-01-28 ENCOUNTER — Other Ambulatory Visit: Payer: Self-pay | Admitting: Nurse Practitioner

## 2024-01-28 DIAGNOSIS — J4520 Mild intermittent asthma, uncomplicated: Secondary | ICD-10-CM

## 2024-01-28 MED ORDER — ALBUTEROL SULFATE HFA 108 (90 BASE) MCG/ACT IN AERS: 1.0000 | INHALATION_SPRAY | Freq: Four times a day (QID) | RESPIRATORY_TRACT | 3 refills | Status: DC | PRN

## 2024-01-28 NOTE — Telephone Encounter (Signed)
 Copied from CRM #[id number]. Topic: Clinical - Medication Refill >> Jan 28, 2024  8:32 AM Marilyn Hamilton wrote: Medication: albuterol  (VENTOLIN  HFA) 108 (90 Base) MCG/ACT inhaler  Has the patient contacted their pharmacy? Yes (Agent: If no, request that the patient contact the pharmacy for the refill. If patient does not wish to contact the pharmacy document the reason why and proceed with request.) (Agent: If yes, when and what did the pharmacy advise?)  This is the patient's preferred pharmacy:  [id number] - [address] [place] [address] Phone: [phone] Fax: [phone]  Is this the correct pharmacy for this prescription? Yes If no, delete pharmacy and type the correct one.   Has the prescription been filled recently? No  Is the patient out of the medication? Yes  Has the patient been seen for an appointment in the last year OR does the patient have an upcoming appointment? Yes  Can we respond through MyChart? No  Agent: Please be advised that Rx refills may take up to 3 business days. We ask that you follow-up with your pharmacy.

## 2024-02-25 ENCOUNTER — Other Ambulatory Visit: Payer: Self-pay

## 2024-02-25 DIAGNOSIS — J4520 Mild intermittent asthma, uncomplicated: Secondary | ICD-10-CM

## 2024-02-25 MED ORDER — ALBUTEROL SULFATE HFA 108 (90 BASE) MCG/ACT IN AERS: 1.0000 | INHALATION_SPRAY | Freq: Four times a day (QID) | RESPIRATORY_TRACT | 3 refills | Status: AC | PRN

## 2024-02-25 NOTE — Addendum Note (Signed)
 Addended by: GRETEL APP on: 02/25/2024 04:26 PM   Modules accepted: Orders

## 2024-02-25 NOTE — Addendum Note (Signed)
 Addended by: ORLANDO KINGDOM on: 02/25/2024 04:22 PM   Modules accepted: Orders

## 2024-02-25 NOTE — Telephone Encounter (Signed)
 Copied from CRM #[id number]. Topic: Clinical - Prescription Issue >> Feb 25, 2024  3:01 PM Franky GRADE wrote: Reason for CRM: [facility] is calling because [facility] $87 for the albuterol  (VENTOLIN  HFA) 108 (90 Base) MCG/ACT inhaler [[id number]]; however, she pays less at the [facility] on [address] and would like the prescription to be sent there.

## 2024-04-21 ENCOUNTER — Encounter: Payer: Managed Care, Other (non HMO) | Admitting: Nurse Practitioner
